# Patient Record
Sex: Female | Born: 1960 | Race: Black or African American | Hispanic: No | Marital: Single | Smoking: Current every day smoker
Health system: Southern US, Community
[De-identification: ages and names within clinical notes are randomized; demographics above are authoritative.]

## PROBLEM LIST (undated history)

## (undated) DIAGNOSIS — Z720 Tobacco use: Secondary | ICD-10-CM

## (undated) DIAGNOSIS — M6282 Rhabdomyolysis: Secondary | ICD-10-CM

## (undated) HISTORY — PX: JOINT REPLACEMENT: SHX530

## (undated) HISTORY — PX: KNEE ARTHROSCOPY: SUR90

---

## 2007-07-18 ENCOUNTER — Emergency Department: Payer: Self-pay | Admitting: Internal Medicine

## 2008-11-30 ENCOUNTER — Emergency Department: Payer: Self-pay | Admitting: Emergency Medicine

## 2011-04-19 ENCOUNTER — Emergency Department: Payer: Self-pay | Admitting: Emergency Medicine

## 2011-06-05 ENCOUNTER — Emergency Department: Payer: Self-pay | Admitting: Emergency Medicine

## 2011-06-06 ENCOUNTER — Emergency Department: Payer: Self-pay | Admitting: Emergency Medicine

## 2011-06-08 ENCOUNTER — Emergency Department: Payer: Self-pay | Admitting: Emergency Medicine

## 2011-06-15 ENCOUNTER — Emergency Department: Payer: Self-pay | Admitting: Emergency Medicine

## 2011-06-16 ENCOUNTER — Emergency Department: Payer: Self-pay | Admitting: Emergency Medicine

## 2011-08-18 ENCOUNTER — Emergency Department: Payer: Self-pay | Admitting: Emergency Medicine

## 2011-08-18 LAB — URINALYSIS, COMPLETE
Bacteria: NONE SEEN
Blood: NEGATIVE
Glucose,UR: NEGATIVE mg/dL (ref 0–75)
Leukocyte Esterase: NEGATIVE
Nitrite: NEGATIVE
Ph: 7 (ref 4.5–8.0)
Protein: NEGATIVE
RBC,UR: 1 /HPF (ref 0–5)
Specific Gravity: 1.011 (ref 1.003–1.030)
Squamous Epithelial: 2
WBC UR: 1 /HPF (ref 0–5)

## 2011-08-18 LAB — CBC WITH DIFFERENTIAL/PLATELET
Basophil %: 0.3 %
Eosinophil #: 0 10*3/uL (ref 0.0–0.7)
Eosinophil %: 0.2 %
HCT: 43.9 % (ref 35.0–47.0)
HGB: 14.7 g/dL (ref 12.0–16.0)
Lymphocyte #: 1.5 10*3/uL (ref 1.0–3.6)
Lymphocyte %: 15.8 %
MCV: 92 fL (ref 80–100)
Monocyte %: 5.5 %
Neutrophil #: 7.5 10*3/uL — ABNORMAL HIGH (ref 1.4–6.5)
Platelet: 310 10*3/uL (ref 150–440)
RBC: 4.79 10*6/uL (ref 3.80–5.20)
WBC: 9.5 10*3/uL (ref 3.6–11.0)

## 2011-08-18 LAB — COMPREHENSIVE METABOLIC PANEL
Albumin: 4.6 g/dL (ref 3.4–5.0)
Alkaline Phosphatase: 90 U/L (ref 50–136)
Bilirubin,Total: 1.7 mg/dL — ABNORMAL HIGH (ref 0.2–1.0)
Calcium, Total: 10 mg/dL (ref 8.5–10.1)
Co2: 28 mmol/L (ref 21–32)
EGFR (Non-African Amer.): 59 — ABNORMAL LOW
Glucose: 117 mg/dL — ABNORMAL HIGH (ref 65–99)
Osmolality: 281 (ref 275–301)
SGOT(AST): 21 U/L (ref 15–37)
SGPT (ALT): 20 U/L
Sodium: 141 mmol/L (ref 136–145)

## 2011-08-18 LAB — DRUG SCREEN, URINE
Barbiturates, Ur Screen: NEGATIVE (ref ?–200)
Cannabinoid 50 Ng, Ur ~~LOC~~: NEGATIVE (ref ?–50)
Cocaine Metabolite,Ur ~~LOC~~: POSITIVE (ref ?–300)
Methadone, Ur Screen: NEGATIVE (ref ?–300)
Opiate, Ur Screen: POSITIVE (ref ?–300)
Phencyclidine (PCP) Ur S: NEGATIVE (ref ?–25)

## 2011-08-18 LAB — LIPASE, BLOOD: Lipase: 58 U/L — ABNORMAL LOW (ref 73–393)

## 2011-09-02 ENCOUNTER — Emergency Department: Payer: Self-pay | Admitting: Unknown Physician Specialty

## 2011-09-02 LAB — COMPREHENSIVE METABOLIC PANEL
Albumin: 4.1 g/dL (ref 3.4–5.0)
Alkaline Phosphatase: 97 U/L (ref 50–136)
Anion Gap: 12 (ref 7–16)
BUN: 8 mg/dL (ref 7–18)
Bilirubin,Total: 1 mg/dL (ref 0.2–1.0)
Chloride: 106 mmol/L (ref 98–107)
Co2: 25 mmol/L (ref 21–32)
Creatinine: 0.72 mg/dL (ref 0.60–1.30)
EGFR (Non-African Amer.): >60
Osmolality: 285 (ref 275–301)
Potassium: 4.1 mmol/L (ref 3.5–5.1)
Sodium: 143 mmol/L (ref 136–145)
Total Protein: 7.6 g/dL (ref 6.4–8.2)

## 2011-09-02 LAB — URINALYSIS, COMPLETE
Bilirubin,UR: NEGATIVE
Glucose,UR: 50 mg/dL (ref 0–75)
Leukocyte Esterase: NEGATIVE
Nitrite: NEGATIVE
Ph: 8 (ref 4.5–8.0)
Protein: NEGATIVE
Specific Gravity: 1.009 (ref 1.003–1.030)
WBC UR: 3 /HPF (ref 0–5)

## 2011-09-02 LAB — CBC
HGB: 13.6 g/dL (ref 12.0–16.0)
MCH: 30.5 pg (ref 26.0–34.0)
MCV: 92 fL (ref 80–100)

## 2011-09-02 LAB — DRUG SCREEN, URINE
Cannabinoid 50 Ng, Ur ~~LOC~~: NEGATIVE (ref ?–50)
Cocaine Metabolite,Ur ~~LOC~~: POSITIVE (ref ?–300)
MDMA (Ecstasy)Ur Screen: NEGATIVE (ref ?–500)
Methadone, Ur Screen: NEGATIVE (ref ?–300)
Opiate, Ur Screen: POSITIVE (ref ?–300)
Phencyclidine (PCP) Ur S: NEGATIVE (ref ?–25)

## 2011-09-02 LAB — RAPID INFLUENZA A&B ANTIGENS

## 2011-09-02 LAB — CK TOTAL AND CKMB (NOT AT ARMC): CK, Total: 133 U/L (ref 21–215)

## 2011-09-02 LAB — TSH: Thyroid Stimulating Horm: 0.282 u[IU]/mL — ABNORMAL LOW

## 2011-09-02 LAB — MAGNESIUM: Magnesium: 1.7 mg/dL — ABNORMAL LOW

## 2011-09-08 LAB — CULTURE, BLOOD (SINGLE)

## 2012-01-28 ENCOUNTER — Emergency Department: Payer: Self-pay | Admitting: Emergency Medicine

## 2012-11-07 ENCOUNTER — Emergency Department: Payer: Self-pay | Admitting: Emergency Medicine

## 2013-02-05 ENCOUNTER — Emergency Department: Payer: Self-pay | Admitting: Unknown Physician Specialty

## 2013-03-09 ENCOUNTER — Emergency Department: Payer: Self-pay | Admitting: Emergency Medicine

## 2013-03-09 LAB — DRUG SCREEN, URINE
Amphetamines, Ur Screen: NEGATIVE (ref ?–1000)
Benzodiazepine, Ur Scrn: NEGATIVE (ref ?–200)
Cannabinoid 50 Ng, Ur ~~LOC~~: POSITIVE (ref ?–50)
MDMA (Ecstasy)Ur Screen: NEGATIVE (ref ?–500)
Methadone, Ur Screen: NEGATIVE (ref ?–300)
Tricyclic, Ur Screen: NEGATIVE (ref ?–1000)

## 2013-03-09 LAB — URINALYSIS, COMPLETE
Bilirubin,UR: NEGATIVE
Ketone: NEGATIVE
Leukocyte Esterase: NEGATIVE
Ph: 6 (ref 4.5–8.0)
Protein: NEGATIVE
Specific Gravity: 1.023 (ref 1.003–1.030)
Squamous Epithelial: 6

## 2013-03-09 LAB — CBC
HGB: 12.9 g/dL (ref 12.0–16.0)
MCH: 31 pg (ref 26.0–34.0)
MCHC: 34.5 g/dL (ref 32.0–36.0)
MCV: 90 fL (ref 80–100)
Platelet: 234 10*3/uL (ref 150–440)
RBC: 4.17 10*6/uL (ref 3.80–5.20)
RDW: 14.2 % (ref 11.5–14.5)
WBC: 9.4 10*3/uL (ref 3.6–11.0)

## 2013-03-09 LAB — COMPREHENSIVE METABOLIC PANEL
Albumin: 3.7 g/dL (ref 3.4–5.0)
Alkaline Phosphatase: 111 U/L (ref 50–136)
Anion Gap: 4 — ABNORMAL LOW (ref 7–16)
Bilirubin,Total: 0.5 mg/dL (ref 0.2–1.0)
Calcium, Total: 8.7 mg/dL (ref 8.5–10.1)
Chloride: 110 mmol/L — ABNORMAL HIGH (ref 98–107)
EGFR (African American): >60
Osmolality: 283 (ref 275–301)
Potassium: 4.3 mmol/L (ref 3.5–5.1)
SGOT(AST): 18 U/L (ref 15–37)
SGPT (ALT): 15 U/L (ref 12–78)
Total Protein: 7 g/dL (ref 6.4–8.2)

## 2013-03-09 LAB — ETHANOL: Ethanol: <3 mg/dL

## 2015-01-31 ENCOUNTER — Emergency Department
Admission: EM | Admit: 2015-01-31 | Discharge: 2015-02-01 | Disposition: A | Discharge status: Home / Self Care | Payer: Self-pay | Attending: Student | Admitting: Student

## 2015-01-31 ENCOUNTER — Encounter: Payer: Self-pay | Admitting: Emergency Medicine

## 2015-01-31 DIAGNOSIS — K047 Periapical abscess without sinus: Secondary | ICD-10-CM | POA: Insufficient documentation

## 2015-01-31 DIAGNOSIS — K0381 Cracked tooth: Secondary | ICD-10-CM | POA: Insufficient documentation

## 2015-01-31 DIAGNOSIS — Z720 Tobacco use: Secondary | ICD-10-CM | POA: Insufficient documentation

## 2015-01-31 DIAGNOSIS — K029 Dental caries, unspecified: Secondary | ICD-10-CM | POA: Insufficient documentation

## 2015-01-31 MED ORDER — PENICILLIN V POTASSIUM 500 MG PO TABS
500.0000 mg | ORAL_TABLET | Freq: Once | ORAL | Status: AC
  Administered 2015-02-01: 500 mg via ORAL

## 2015-01-31 MED ORDER — TRAMADOL HCL 50 MG PO TABS
100.0000 mg | ORAL_TABLET | Freq: Once | ORAL | Status: AC
  Administered 2015-02-01: 100 mg via ORAL

## 2015-01-31 MED ORDER — TRAMADOL HCL 50 MG PO TABS: 50.0000 mg | ORAL_TABLET | Freq: Four times a day (QID) | ORAL | Status: DC | PRN

## 2015-01-31 MED ORDER — PENICILLIN V POTASSIUM 500 MG PO TABS: 500.0000 mg | ORAL_TABLET | Freq: Four times a day (QID) | ORAL | Status: DC

## 2015-01-31 NOTE — ED Provider Notes (Signed)
CSN: [id number]     Arrival date & time 01/31/15  2118 History   First MD Initiated Contact with Patient 01/31/15 2328     Chief Complaint  Patient presents with  . Oral Swelling     (Consider location/radiation/quality/duration/timing/severity/associated sxs/prior Treatment) HPI  54 year old female presents today for evaluation of upper lip swelling. Swelling began 5 days ago and was moderate to severe but has improved over the last 2 days. Pain is 10 out of 10, described as pressure and tightness. Patient states she has a fractured tooth. She has not been taking any medications for pain. She has not had any antibiotic. She denies any trauma or injury. No fevers. She is able tolerate by mouth well. Has been using listerine.    History reviewed. No pertinent past medical history. History reviewed. No pertinent past surgical history. No family history on file. History  Substance Use Topics  . Smoking status: Current Every Day Smoker -- 0.50 packs/day for 40 years    Types: Cigarettes  . Smokeless tobacco: Not on file  . Alcohol Use: No   OB History    No data available     Review of Systems  Constitutional: Negative.  Negative for fever and chills.  HENT: Positive for dental problem and facial swelling. Negative for drooling, mouth sores, trouble swallowing and voice change.   Respiratory: Negative for chest tightness and shortness of breath.   Cardiovascular: Negative for chest pain.  Gastrointestinal: Negative for nausea, vomiting, abdominal pain and diarrhea.  Musculoskeletal: Negative for arthralgias, neck pain and neck stiffness.  Skin: Negative.   Psychiatric/Behavioral: Negative for confusion.  All other systems reviewed and are negative.     Allergies  Review of patient's allergies indicates no known allergies.  Home Medications   Prior to Admission medications   Medication Sig Start Date End Date Taking? Authorizing Provider  penicillin v potassium (VEETID)  500 MG tablet Take 1 tablet (500 mg total) by mouth 4 (four) times daily. 01/31/15   Evon Slack, PA-C  traMADol (ULTRAM) 50 MG tablet Take 1 tablet (50 mg total) by mouth every 6 (six) hours as needed. 01/31/15   Evon Slack, PA-C   BP 138/83 mmHg  Pulse 57  Temp(Src) 98.9 F (37.2 C) (Oral)  Resp 18  Ht 5' 7.5" (1.715 m)  Wt 138 lb (62.596 kg)  BMI 21.28 kg/m2  SpO2 96% Physical Exam  Constitutional: She is oriented to person, place, and time. She appears well-developed and well-nourished. No distress.  HENT:  Head: Normocephalic and atraumatic.  Right Ear: External ear normal.  Left Ear: External ear normal.  Nose: Nose normal.  Mouth/Throat: Uvula is midline and oropharynx is clear and moist. No oral lesions. No trismus in the jaw. Normal dentition. Dental abscesses (positive drainage with compression to abscess.) and dental caries present. No uvula swelling.    Eyes: EOM are normal.  Neck: Normal range of motion. Neck supple.  Cardiovascular: Normal rate.  Exam reveals no gallop and no friction rub.   No murmur heard. Pulmonary/Chest: Effort normal and breath sounds normal. No respiratory distress.  Neurological: She is alert and oriented to person, place, and time.  Skin: Skin is warm and dry.  Psychiatric: She has a normal mood and affect. Her behavior is normal. Thought content normal.    ED Course  Procedures (including critical care time) Labs Review Labs Reviewed - No data to display  Imaging Review No results found.   EKG Interpretation None  MDM   Final diagnoses:  Dental abscess    54 year old female with dental abscess that has improved on its own over the last 2 days. Based on history and exam it appears that abscess has ruptured and has been draining. Minimal drainage on exam today. She will be started with anabiotic's, penicillin VK. She will also be given tramadol for pain. Follow-up with the dental clinic and return to the ER for any  worsening symptoms or changes in her health.    Evon Slack, PA-C 01/31/15 [id number]  Gayla Doss, MD 01/31/15 [phone]

## 2015-01-31 NOTE — ED Notes (Signed)
Patient with complaint of swelling to her top gums and lip that started Sunday. Patient reports that the swelling has gone down some today.

## 2015-01-31 NOTE — Discharge Instructions (Signed)

## 2015-02-01 MED ORDER — PENICILLIN V POTASSIUM 500 MG PO TABS
ORAL_TABLET | ORAL | Status: AC
  Administered 2015-02-01: 500 mg via ORAL
  Filled 2015-02-01: qty 1

## 2015-02-01 MED ORDER — TRAMADOL HCL 50 MG PO TABS
ORAL_TABLET | ORAL | Status: AC
  Administered 2015-02-01: 100 mg via ORAL
  Filled 2015-02-01: qty 2

## 2015-02-01 MED ORDER — TRAMADOL HCL 50 MG PO TABS
ORAL_TABLET | ORAL | Status: AC
  Filled 2015-02-01: qty 1

## 2016-09-23 ENCOUNTER — Emergency Department: Payer: Self-pay

## 2016-09-23 ENCOUNTER — Encounter: Payer: Self-pay | Admitting: Emergency Medicine

## 2016-09-23 ENCOUNTER — Emergency Department
Admission: EM | Admit: 2016-09-23 | Discharge: 2016-09-23 | Disposition: A | Discharge status: Home / Self Care | Payer: Self-pay | Attending: Emergency Medicine | Admitting: Emergency Medicine

## 2016-09-23 DIAGNOSIS — J209 Acute bronchitis, unspecified: Secondary | ICD-10-CM

## 2016-09-23 DIAGNOSIS — F17210 Nicotine dependence, cigarettes, uncomplicated: Secondary | ICD-10-CM | POA: Insufficient documentation

## 2016-09-23 DIAGNOSIS — J219 Acute bronchiolitis, unspecified: Secondary | ICD-10-CM | POA: Insufficient documentation

## 2016-09-23 LAB — INFLUENZA PANEL BY PCR (TYPE A & B)
Influenza A By PCR: NEGATIVE
Influenza B By PCR: NEGATIVE

## 2016-09-23 LAB — CBC WITH DIFFERENTIAL/PLATELET
BASOS PCT: 1 %
Basophils Absolute: 0 10*3/uL (ref 0–0.1)
Eosinophils Absolute: 0.1 10*3/uL (ref 0–0.7)
Eosinophils Relative: 2 %
HEMATOCRIT: 37.7 % (ref 35.0–47.0)
Hemoglobin: 12.9 g/dL (ref 12.0–16.0)
LYMPHS PCT: 36 %
Lymphs Abs: 1.9 10*3/uL (ref 1.0–3.6)
MCH: 31.6 pg (ref 26.0–34.0)
MCHC: 34.1 g/dL (ref 32.0–36.0)
MCV: 92.7 fL (ref 80.0–100.0)
MONO ABS: 0.6 10*3/uL (ref 0.2–0.9)
Monocytes Relative: 11 %
NEUTROS PCT: 50 %
Neutro Abs: 2.7 10*3/uL (ref 1.4–6.5)
Platelets: 223 10*3/uL (ref 150–440)
RBC: 4.07 MIL/uL (ref 3.80–5.20)
RDW: 13.7 % (ref 11.5–14.5)
WBC: 5.4 10*3/uL (ref 3.6–11.0)

## 2016-09-23 LAB — COMPREHENSIVE METABOLIC PANEL
ALT: 28 U/L (ref 14–54)
AST: 35 U/L (ref 15–41)
Albumin: 4.1 g/dL (ref 3.5–5.0)
Alkaline Phosphatase: 97 U/L (ref 38–126)
Anion gap: 10 (ref 5–15)
BUN: 24 mg/dL — ABNORMAL HIGH (ref 6–20)
CO2: 24 mmol/L (ref 22–32)
Calcium: 8.8 mg/dL — ABNORMAL LOW (ref 8.9–10.3)
Chloride: 109 mmol/L (ref 101–111)
Creatinine, Ser: 1.41 mg/dL — ABNORMAL HIGH (ref 0.44–1.00)
GFR calc Af Amer: 48 mL/min — ABNORMAL LOW (ref >60)
GFR calc non Af Amer: 42 mL/min — ABNORMAL LOW (ref >60)
Glucose, Bld: 140 mg/dL — ABNORMAL HIGH (ref 65–99)
Potassium: 3.7 mmol/L (ref 3.5–5.1)
Sodium: 143 mmol/L (ref 135–145)
Total Bilirubin: 0.7 mg/dL (ref 0.3–1.2)
Total Protein: 7.3 g/dL (ref 6.5–8.1)

## 2016-09-23 LAB — TROPONIN I: Troponin I: <0.03 ng/mL (ref <0.03)

## 2016-09-23 MED ORDER — HYDROCOD POLST-CPM POLST ER 10-8 MG/5ML PO SUER: 5.0000 mL | Freq: Two times a day (BID) | ORAL | 0 refills | Status: DC | PRN

## 2016-09-23 MED ORDER — HYDROCOD POLST-CPM POLST ER 10-8 MG/5ML PO SUER
5.0000 mL | Freq: Once | ORAL | Status: AC
Start: 2016-09-23 — End: 2016-09-23
  Administered 2016-09-23: 5 mL via ORAL
  Filled 2016-09-23: qty 5

## 2016-09-23 MED ORDER — AZITHROMYCIN 250 MG PO TABS: ORAL_TABLET | ORAL | 0 refills | Status: AC

## 2016-09-23 NOTE — ED Triage Notes (Signed)
Patient to ER for c/o URI. Patient states she first developed "head cold" approx 1 week ago. Patient states in last day or so she feels like "someone that is 6-700 lbs is sitting on her chest with my breathing". Patient states "I think I might have pneumonia".

## 2016-09-23 NOTE — ED Notes (Signed)
Blood draw delayed d/t patient being hesitant to have blood draw d/t fear of needles.

## 2016-09-23 NOTE — ED Provider Notes (Signed)
[facility] Emergency Department Provider Note    First MD Initiated Contact with Patient 09/23/16 [phone]     (approximate)  I have reviewed the triage vital signs and the nursing notes.   HISTORY  Chief Complaint URI    HPI Heather Henry is a 56 y.o. female presents with one-week history of cough congestion chills and subjective fevers. Patient states that "I think I might have pneumonia". Patient denies any known sick contacts.   Past medical history No pertinent past There are no active problems to display for this patient.   Past Surgical History:  Procedure Laterality Date  . KNEE ARTHROSCOPY Right     Prior to Admission medications   Medication Sig Start Date End Date Taking? Authorizing Provider  azithromycin (ZITHROMAX Z-PAK) 250 MG tablet Take 2 tablets (500 mg) on  Day 1,  followed by 1 tablet (250 mg) once daily on Days 2 through 5. 09/23/16 09/28/16  Darci Currentandolph N Tiahna Cure, MD  chlorpheniramine-HYDROcodone [facility](TUSSIONEX PENNKINETIC ER) 10-8 MG/5ML SUER Take 5 mLs by mouth every 12 (twelve) hours as needed. 09/23/16   Darci Currentandolph N Almee Pelphrey, MD  penicillin v potassium (VEETID) 500 MG tablet Take 1 tablet (500 mg total) by mouth 4 (four) times daily. 01/31/15   Evon Slackhomas C Gaines, PA-C  traMADol (ULTRAM) 50 MG tablet Take 1 tablet (50 mg total) by mouth every 6 (six) hours as needed. 01/31/15   Evon Slackhomas C Gaines, PA-C    Allergies No known drug allergies No family history on file.  Social History Social History  Substance Use Topics  . Smoking status: Current Every Day Smoker    Packs/day: 0.50    Years: 40.00    Types: Cigarettes  . Smokeless tobacco: Never Used  . Alcohol use No    Review of Systems Constitutional: Positive for fever/chills Eyes: No visual changes. ENT: No sore throat. Positive for nasal congestion Cardiovascular: Denies chest pain. Respiratory: Denies shortness of breath.Positive for cough Gastrointestinal: No abdominal  pain.  No nausea, no vomiting.  No diarrhea.  No constipation. Genitourinary: Negative for dysuria. Musculoskeletal: Negative for back pain. Skin: Negative for rash. Neurological: Negative for headaches, focal weakness or numbness.  10-point ROS otherwise negative.  ____________________________________________   PHYSICAL EXAM:  VITAL SIGNS: ED Triage Vitals [09/23/16 0147]  Enc Vitals Group     BP 112/89     Pulse Rate 88     Resp 18     Temp 98.2 F (36.8 C)     Temp Source Oral     SpO2 95 %     Weight 153 lb (69.4 kg)     Height 5' 7.5" (1.715 m)     Head Circumference      Peak Flow      Pain Score 9     Pain Loc      Pain Edu?      Excl. in GC?     Constitutional: Alert and oriented. Well appearing and in no acute distress. Eyes: Conjunctivae are normal. PERRL. EOMI. Head: Atraumatic. Ears:  Healthy appearing ear canals and TMs bilaterally Nose: No congestion/rhinnorhea. Mouth/Throat: Mucous membranes are moist. Oropharynx non-erythematous. Neck: No stridor.  No meningeal signs.  No cervical spine tenderness to palpation Cardiovascular: Normal rate, regular rhythm. Good peripheral circulation. Grossly normal heart sounds. Respiratory: Normal respiratory effort.  No retractions. Lungs CTAB. Gastrointestinal: Soft and nontender. No distention.  Musculoskeletal: No lower extremity tenderness nor edema. No gross deformities of extremities. Neurologic:  Normal speech  and language. No gross focal neurologic deficits are appreciated.  Skin:  Skin is warm, dry and intact. No rash noted. Psychiatric: Mood and affect are normal. Speech and behavior are normal.  ____________________________________________   LABS (all labs ordered are listed, but only abnormal results are displayed)  Labs Reviewed  COMPREHENSIVE METABOLIC PANEL - Abnormal; Notable for the following:       Result Value   Glucose, Bld 140 (*)    BUN 24 (*)    Creatinine, Ser 1.41 (*)    Calcium 8.8  (*)    GFR calc non Af Amer 42 (*)    GFR calc Af Amer 48 (*)    All other components within normal limits  CBC WITH DIFFERENTIAL/PLATELET  TROPONIN I  INFLUENZA PANEL BY PCR (TYPE A & B)   ____________________________________________  EKG  ED ECG REPORT I, [place] N Hulen Mandler, the attending physician, personally viewed and interpreted this ECG.   Date: 09/23/2016  EKG Time: 1:52 AM  Rate: 85  Rhythm: Normal sinus rhythm  Axis: Normal  Intervals: Normal  ST&T Change: None  ____________________________________________  RADIOLOGY I, [place] N Chesney Klimaszewski, personally viewed and evaluated these images (plain radiographs) as part of my medical decision making, as well as reviewing the written report by the radiologist.  Dg Chest 2 View  Result Date: 09/23/2016 CLINICAL DATA:  Upper respiratory infection. Patient developed a head cold 1 week ago. Now with shortness of breath. Mid chest pain. Cough. Smoker. EXAM: CHEST  2 VIEW COMPARISON:  02/06/2013 FINDINGS: Mild hyperinflation. The heart size and mediastinal contours are within normal limits. Both lungs are clear. The visualized skeletal structures are unremarkable. IMPRESSION: No active cardiopulmonary disease. Electronically Signed   By: Burman Nieves M.D.   On: 09/23/2016 02:16    ____________________________________________    Procedures     INITIAL IMPRESSION / ASSESSMENT AND PLAN / ED COURSE  Pertinent labs & imaging results that were available during my care of the patient were reviewed by me and considered in my medical decision making (see chart for details).        ____________________________________________  FINAL CLINICAL IMPRESSION(S) / ED DIAGNOSES  Final diagnoses:  Acute bronchitis, unspecified organism     MEDICATIONS GIVEN DURING THIS VISIT:  Medications  chlorpheniramine-HYDROcodone (TUSSIONEX) 10-8 MG/5ML suspension 5 mL (5 mLs Oral Given 09/23/16 0445)     NEW OUTPATIENT MEDICATIONS  STARTED DURING THIS VISIT:  New Prescriptions   AZITHROMYCIN (ZITHROMAX Z-PAK) 250 MG TABLET    Take 2 tablets (500 mg) on  Day 1,  followed by 1 tablet (250 mg) once daily on Days 2 through 5.   CHLORPHENIRAMINE-HYDROCODONE (TUSSIONEX PENNKINETIC ER) 10-8 MG/5ML SUER    Take 5 mLs by mouth every 12 (twelve) hours as needed.    Modified Medications   No medications on file    Discontinued Medications   No medications on file     Note:  This document was prepared using Dragon voice recognition software and may include unintentional dictation errors.    Darci Current, MD 09/23/16 [phone]

## 2016-09-23 NOTE — ED Notes (Signed)
Patient transported to X-ray 

## 2016-10-04 ENCOUNTER — Emergency Department
Admission: EM | Admit: 2016-10-04 | Discharge: 2016-10-04 | Disposition: A | Discharge status: Home / Self Care | Payer: Self-pay | Attending: Emergency Medicine | Admitting: Emergency Medicine

## 2016-10-04 ENCOUNTER — Encounter: Payer: Self-pay | Admitting: Emergency Medicine

## 2016-10-04 ENCOUNTER — Emergency Department: Payer: Self-pay

## 2016-10-04 DIAGNOSIS — H60501 Unspecified acute noninfective otitis externa, right ear: Secondary | ICD-10-CM | POA: Insufficient documentation

## 2016-10-04 DIAGNOSIS — F17210 Nicotine dependence, cigarettes, uncomplicated: Secondary | ICD-10-CM | POA: Insufficient documentation

## 2016-10-04 DIAGNOSIS — J40 Bronchitis, not specified as acute or chronic: Secondary | ICD-10-CM | POA: Insufficient documentation

## 2016-10-04 LAB — COMPREHENSIVE METABOLIC PANEL
ALBUMIN: 4 g/dL (ref 3.5–5.0)
ALT: 15 U/L (ref 14–54)
ANION GAP: 7 (ref 5–15)
AST: 21 U/L (ref 15–41)
Alkaline Phosphatase: 94 U/L (ref 38–126)
BUN: 24 mg/dL — ABNORMAL HIGH (ref 6–20)
CALCIUM: 9 mg/dL (ref 8.9–10.3)
CO2: 24 mmol/L (ref 22–32)
Chloride: 110 mmol/L (ref 101–111)
Creatinine, Ser: 1.11 mg/dL — ABNORMAL HIGH (ref 0.44–1.00)
GFR calc non Af Amer: 56 mL/min — ABNORMAL LOW (ref >60)
GLUCOSE: 132 mg/dL — AB (ref 65–99)
POTASSIUM: 3.7 mmol/L (ref 3.5–5.1)
SODIUM: 141 mmol/L (ref 135–145)
Total Bilirubin: 0.5 mg/dL (ref 0.3–1.2)
Total Protein: 7.1 g/dL (ref 6.5–8.1)

## 2016-10-04 LAB — CBC
HCT: 37.9 % (ref 35.0–47.0)
Hemoglobin: 13 g/dL (ref 12.0–16.0)
MCH: 31.8 pg (ref 26.0–34.0)
MCHC: 34.4 g/dL (ref 32.0–36.0)
MCV: 92.6 fL (ref 80.0–100.0)
PLATELETS: 260 10*3/uL (ref 150–440)
RBC: 4.1 MIL/uL (ref 3.80–5.20)
RDW: 13.5 % (ref 11.5–14.5)
WBC: 6.5 10*3/uL (ref 3.6–11.0)

## 2016-10-04 LAB — TROPONIN I: Troponin I: <0.03 ng/mL (ref <0.03)

## 2016-10-04 MED ORDER — IBUPROFEN 600 MG PO TABS
600.0000 mg | ORAL_TABLET | Freq: Once | ORAL | Status: AC
Start: 2016-10-04 — End: 2016-10-04
  Administered 2016-10-04: 600 mg via ORAL
  Filled 2016-10-04: qty 1

## 2016-10-04 MED ORDER — OFLOXACIN 0.3 % OT SOLN: 5.0000 [drp] | Freq: Two times a day (BID) | OTIC | 0 refills | Status: AC

## 2016-10-04 MED ORDER — IPRATROPIUM-ALBUTEROL 0.5-2.5 (3) MG/3ML IN SOLN
3.0000 mL | Freq: Once | RESPIRATORY_TRACT | Status: AC
  Administered 2016-10-04: 3 mL via RESPIRATORY_TRACT
  Filled 2016-10-04: qty 3

## 2016-10-04 MED ORDER — PREDNISONE 20 MG PO TABS
60.0000 mg | ORAL_TABLET | Freq: Once | ORAL | Status: AC
  Administered 2016-10-04: 60 mg via ORAL
  Filled 2016-10-04: qty 3

## 2016-10-04 MED ORDER — BENZONATATE 100 MG PO CAPS: 100.0000 mg | ORAL_CAPSULE | Freq: Four times a day (QID) | ORAL | 0 refills | Status: DC | PRN

## 2016-10-04 MED ORDER — ALBUTEROL SULFATE HFA 108 (90 BASE) MCG/ACT IN AERS: 2.0000 | INHALATION_SPRAY | Freq: Four times a day (QID) | RESPIRATORY_TRACT | 0 refills | Status: DC | PRN

## 2016-10-04 MED ORDER — BENZONATATE 100 MG PO CAPS
100.0000 mg | ORAL_CAPSULE | Freq: Once | ORAL | Status: AC
  Administered 2016-10-04: 100 mg via ORAL
  Filled 2016-10-04: qty 1

## 2016-10-04 MED ORDER — ALBUTEROL SULFATE (2.5 MG/3ML) 0.083% IN NEBU
2.5000 mg | INHALATION_SOLUTION | Freq: Once | RESPIRATORY_TRACT | Status: AC
  Administered 2016-10-04: 2.5 mg via RESPIRATORY_TRACT
  Filled 2016-10-04: qty 3

## 2016-10-04 MED ORDER — PREDNISONE 20 MG PO TABS: 60.0000 mg | ORAL_TABLET | Freq: Every day | ORAL | 0 refills | Status: DC

## 2016-10-04 MED ORDER — MAGNESIUM SULFATE 2 GM/50ML IV SOLN
2.0000 g | Freq: Once | INTRAVENOUS | Status: AC
  Administered 2016-10-04: 2 g via INTRAVENOUS
  Filled 2016-10-04: qty 50

## 2016-10-04 NOTE — ED Triage Notes (Signed)
Pt seen here on 2/15 and diagnosed with bronchitis; finished antibiotics as prescribed; pt says she has continued sinus drainage with frontal head pressure; earlier when she blew her nose she felt like her right ear popped; pt in no acute distress; talking in complete coherent sentences

## 2016-10-04 NOTE — ED Notes (Signed)
Pt to xray via stretcher with xray tech

## 2016-10-04 NOTE — Discharge Instructions (Signed)
Please follow-up with the acute care clinic physicians for further evaluation of his symptoms and evaluation for resolution.

## 2016-10-04 NOTE — ED Notes (Signed)
Pt returned to room from xay.

## 2016-10-04 NOTE — ED Provider Notes (Signed)
[facility] Emergency Department Provider Note   ____________________________________________   First MD Initiated Contact with Patient 10/04/16 0145     (approximate)  I have reviewed the triage vital signs and the nursing notes.   HISTORY  Chief Complaint Otalgia and Nasal Congestion    HPI Heather Henry is a 56 y.o. female who comes into the hospital today with bronchitis. She reports that she was here last week and diagnosed with bronchitis. She was given azithromycin and Tussionex. She took the medicine but reports that the symptoms came back stronger. She blew her nose and felt something pop in her right ear. Now she is having pain in her ear and her forehead. Patient has a cough and reports it hurts in her neck and upper chest to breathe. The patient reports that she has yellow sputum. The patient has not taken any other medication. Before she had been taking Mucinex, Alka-Seltzer severe cold. She feels short of breath and has no energy. She denies fevers but has had some sweats. The patient does not have a primary care physician.   History reviewed. No pertinent past medical history.  There are no active problems to display for this patient.   Past Surgical History:  Procedure Laterality Date  . KNEE ARTHROSCOPY Right     Prior to Admission medications   Medication Sig Start Date End Date Taking? Authorizing Provider  albuterol (PROVENTIL HFA;VENTOLIN HFA) 108 (90 Base) MCG/ACT inhaler Inhale 2 puffs into the lungs every 6 (six) hours as needed for wheezing or shortness of breath. 10/04/16   Rebecka Apley, MD  benzonatate (TESSALON PERLES) 100 MG capsule Take 1 capsule (100 mg total) by mouth every 6 (six) hours as needed for cough. 10/04/16   Rebecka Apley, MD  chlorpheniramine-HYDROcodone [facility] ER) 10-8 MG/5ML SUER Take 5 mLs by mouth every 12 (twelve) hours as needed. Patient not taking: Reported on 10/04/2016  09/23/16   Darci Current, MD  ofloxacin (FLOXIN) 0.3 % otic solution Place 5 drops into both ears 2 (two) times daily. 10/04/16 10/11/16  Rebecka Apley, MD  penicillin v potassium (VEETID) 500 MG tablet Take 1 tablet (500 mg total) by mouth 4 (four) times daily. Patient not taking: Reported on 10/04/2016 01/31/15   Evon Slack, PA-C  predniSONE (DELTASONE) 20 MG tablet Take 3 tablets (60 mg total) by mouth daily. 10/04/16   Rebecka Apley, MD  traMADol (ULTRAM) 50 MG tablet Take 1 tablet (50 mg total) by mouth every 6 (six) hours as needed. Patient not taking: Reported on 10/04/2016 01/31/15   Evon Slack, PA-C    Allergies Patient has no known allergies.  History reviewed. No pertinent family history.  Social History Social History  Substance Use Topics  . Smoking status: Current Every Day Smoker    Packs/day: 0.50    Years: 40.00    Types: Cigarettes  . Smokeless tobacco: Never Used  . Alcohol use No    Review of Systems Constitutional: No fever/chills Eyes: No visual changes. ENT: right ear pain Cardiovascular:  chest pain with cough Respiratory: Cough and shortness of breath. Gastrointestinal: No abdominal pain.  No nausea, no vomiting.  No diarrhea.  No constipation. Genitourinary: Negative for dysuria. Musculoskeletal: Negative for back pain. Skin: Negative for rash. Neurological: Negative for headaches, focal weakness or numbness.  10-point ROS otherwise negative.  ____________________________________________   PHYSICAL EXAM:  VITAL SIGNS: ED Triage Vitals  Enc Vitals Group  BP 10/04/16 0040 126/77     Pulse Rate 10/04/16 0040 72     Resp 10/04/16 0040 16     Temp 10/04/16 0040 98.5 F (36.9 C)     Temp Source 10/04/16 0040 Oral     SpO2 10/04/16 0040 99 %     Weight 10/04/16 0030 153 lb (69.4 kg)     Height 10/04/16 0030 5' 7.5" (1.715 m)     Head Circumference --      Peak Flow --      Pain Score --      Pain Loc --      Pain Edu? --        Excl. in GC? --     Constitutional: Alert and oriented. Well appearing and in mild distress. Eyes: Conjunctivae are normal. PERRL. EOMI. Ears: Erythema in the canal of the right ear with pain upon manipulation of the pinna Head: Atraumatic. Nose: No congestion/rhinnorhea. Mouth/Throat: Mucous membranes are moist.  Oropharynx non-erythematous. Cardiovascular: Normal rate, regular rhythm. Grossly normal heart sounds.  Good peripheral circulation. Respiratory: Normal respiratory effort.  No retractions. Mild expiratory wheezing in anterior lung fields Gastrointestinal: Soft and nontender. No distention. Positive bowel sounds Musculoskeletal: No lower extremity tenderness nor edema.   Neurologic:  Normal speech and language.  Skin:  Skin is warm, dry and intact.  Psychiatric: Mood and affect are normal. Speech and behavior are normal.  ____________________________________________   LABS (all labs ordered are listed, but only abnormal results are displayed)  Labs Reviewed  COMPREHENSIVE METABOLIC PANEL - Abnormal; Notable for the following:       Result Value   Glucose, Bld 132 (*)    BUN 24 (*)    Creatinine, Ser 1.11 (*)    GFR calc non Af Amer 56 (*)    All other components within normal limits  CBC  TROPONIN I   ____________________________________________  EKG  none ____________________________________________  RADIOLOGY  none ____________________________________________   PROCEDURES  Procedure(s) performed: None  Procedures  Critical Care performed: No  ____________________________________________   INITIAL IMPRESSION / ASSESSMENT AND PLAN / ED COURSE  Pertinent labs & imaging results that were available during my care of the patient were reviewed by me and considered in my medical decision making (see chart for details).  The patient was seen on Feb 15th with the same symptoms. The patient had blood work and an x-ray that was unremarkable at that  time. She reports that she felt she was getting better but the symptoms returned. Given the patient's wheezing this time I'll give her a DuoNeb treatment as well as some prednisone. The patient does appear to have some otitis externa as well. I will consider given the patient levofloxacin as she recently completed a course of azithromycin. I will reassess the patient when she's receive her medications.  Clinical Course as of Oct 04 538  Mon Oct 04, 2016  0300 I did go back in to reassess the patient and she now has increased wheezing. I will give the patient 2 more stacked DuoNeb's and then reassess the patient again.  [AW]  0502 Peribronchial thickening noted.  Lungs otherwise grossly clear. DG Chest 2 View [AW]    Clinical Course User Index [AW] Rebecka ApleyAllison P Gloyd Happ, MD   After 3 duo nebs and steroids the patient still had some wheezing. I went ahead and check some blood work and a chest x-ray. The patient's blood work is unremarkable and her chest x-ray is also negative. I  gave the patient's of magnesium sulfate and 2 more doses of albuterol and her lungs were clear. She'll be discharged home to follow-up with the acute care clinic.  ____________________________________________   FINAL CLINICAL IMPRESSION(S) / ED DIAGNOSES  Final diagnoses:  Bronchitis  Acute otitis externa of right ear, unspecified type      NEW MEDICATIONS STARTED DURING THIS VISIT:  New Prescriptions   ALBUTEROL (PROVENTIL HFA;VENTOLIN HFA) 108 (90 BASE) MCG/ACT INHALER    Inhale 2 puffs into the lungs every 6 (six) hours as needed for wheezing or shortness of breath.   BENZONATATE (TESSALON PERLES) 100 MG CAPSULE    Take 1 capsule (100 mg total) by mouth every 6 (six) hours as needed for cough.   OFLOXACIN (FLOXIN) 0.3 % OTIC SOLUTION    Place 5 drops into both ears 2 (two) times daily.   PREDNISONE (DELTASONE) 20 MG TABLET    Take 3 tablets (60 mg total) by mouth daily.     Note:  This document was prepared  using Dragon voice recognition software and may include unintentional dictation errors.    Rebecka Apley, MD 10/04/16 [phone]

## 2017-09-26 ENCOUNTER — Emergency Department
Admission: EM | Admit: 2017-09-26 | Discharge: 2017-09-26 | Disposition: A | Discharge status: Home / Self Care | Payer: BLUE CROSS/BLUE SHIELD | Attending: Emergency Medicine | Admitting: Emergency Medicine

## 2017-09-26 ENCOUNTER — Encounter: Payer: Self-pay | Admitting: Emergency Medicine

## 2017-09-26 ENCOUNTER — Other Ambulatory Visit: Payer: Self-pay

## 2017-09-26 ENCOUNTER — Emergency Department: Payer: BLUE CROSS/BLUE SHIELD

## 2017-09-26 DIAGNOSIS — F17210 Nicotine dependence, cigarettes, uncomplicated: Secondary | ICD-10-CM | POA: Diagnosis not present

## 2017-09-26 DIAGNOSIS — R1012 Left upper quadrant pain: Secondary | ICD-10-CM | POA: Insufficient documentation

## 2017-09-26 DIAGNOSIS — R0789 Other chest pain: Secondary | ICD-10-CM | POA: Diagnosis not present

## 2017-09-26 LAB — COMPREHENSIVE METABOLIC PANEL
ALBUMIN: 4.4 g/dL (ref 3.5–5.0)
ALT: 20 U/L (ref 14–54)
ANION GAP: 7 (ref 5–15)
AST: 22 U/L (ref 15–41)
Alkaline Phosphatase: 94 U/L (ref 38–126)
BUN: 18 mg/dL (ref 6–20)
CALCIUM: 9 mg/dL (ref 8.9–10.3)
CHLORIDE: 107 mmol/L (ref 101–111)
CO2: 26 mmol/L (ref 22–32)
Creatinine, Ser: 1.15 mg/dL — ABNORMAL HIGH (ref 0.44–1.00)
GFR calc Af Amer: >60 mL/min (ref >60)
GFR calc non Af Amer: 53 mL/min — ABNORMAL LOW (ref >60)
GLUCOSE: 100 mg/dL — AB (ref 65–99)
Potassium: 3.6 mmol/L (ref 3.5–5.1)
SODIUM: 140 mmol/L (ref 135–145)
Total Bilirubin: 1.1 mg/dL (ref 0.3–1.2)
Total Protein: 7.6 g/dL (ref 6.5–8.1)

## 2017-09-26 LAB — URINALYSIS, COMPLETE (UACMP) WITH MICROSCOPIC
BACTERIA UA: NONE SEEN
Bilirubin Urine: NEGATIVE
Glucose, UA: NEGATIVE mg/dL
Hgb urine dipstick: NEGATIVE
KETONES UR: NEGATIVE mg/dL
Leukocytes, UA: NEGATIVE
Nitrite: NEGATIVE
PROTEIN: NEGATIVE mg/dL
Specific Gravity, Urine: >1.046 — ABNORMAL HIGH (ref 1.005–1.030)
pH: 6 (ref 5.0–8.0)

## 2017-09-26 LAB — CBC
HEMATOCRIT: 39.6 % (ref 35.0–47.0)
HEMOGLOBIN: 13.5 g/dL (ref 12.0–16.0)
MCH: 30.9 pg (ref 26.0–34.0)
MCHC: 34.1 g/dL (ref 32.0–36.0)
MCV: 90.6 fL (ref 80.0–100.0)
Platelets: 272 10*3/uL (ref 150–440)
RBC: 4.37 MIL/uL (ref 3.80–5.20)
RDW: 14.1 % (ref 11.5–14.5)
WBC: 7 10*3/uL (ref 3.6–11.0)

## 2017-09-26 LAB — TROPONIN I: Troponin I: <0.03 ng/mL (ref <0.03)

## 2017-09-26 LAB — FIBRIN DERIVATIVES D-DIMER (ARMC ONLY): FIBRIN DERIVATIVES D-DIMER (ARMC): 373.76 ng{FEU}/mL (ref 0.00–499.00)

## 2017-09-26 LAB — LIPASE, BLOOD: Lipase: 24 U/L (ref 11–51)

## 2017-09-26 MED ORDER — OXYCODONE-ACETAMINOPHEN 5-325 MG PO TABS: 1.0000 | ORAL_TABLET | Freq: Three times a day (TID) | ORAL | 0 refills | Status: DC | PRN

## 2017-09-26 MED ORDER — IOPAMIDOL (ISOVUE-300) INJECTION 61%
85.0000 mL | Freq: Once | INTRAVENOUS | Status: AC | PRN
Start: 2017-09-26 — End: 2017-09-26
  Administered 2017-09-26: 85 mL via INTRAVENOUS

## 2017-09-26 MED ORDER — GI COCKTAIL ~~LOC~~
30.0000 mL | Freq: Once | ORAL | Status: AC
  Administered 2017-09-26: 30 mL via ORAL
  Filled 2017-09-26: qty 30

## 2017-09-26 NOTE — ED Triage Notes (Signed)
Pt reports pain to upper left abd quadrant/up under left rib cage area for 1 week; pt says she spread some rocks in her driveway last Saturday and the pain was really bad at that time; pt reports swelling to area as well as "gurgling" sensation; last BM yesterday was normal; denies urinary s/s; vomiting Sunday morning but none since; pt says pain is worse with certain positions and other positions ease her pain; talking in complete coherent sentences;

## 2017-09-26 NOTE — ED Notes (Signed)
PT c/o RT rib cage pain with cough x1wk denies fever. Pt A&Ox4, speech clear , VS stable
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
## 2017-09-26 NOTE — ED Provider Notes (Signed)
On repeat examination she does have some abdominal wall tenderness, labs and CT were unremarkable for any acute findings.  There is mild pancreatic ductal dilatation with normal lipase of uncertain significance.  I have advised she needs close outpatient follow-up.  She will be discharged with pain medicine.   Emily FilbertWilliams, Paw Karstens E, MD 09/26/17 [id number]

## 2017-09-27 LAB — URINE CULTURE
CULTURE: NO GROWTH
Special Requests: NORMAL

## 2017-10-19 ENCOUNTER — Emergency Department: Payer: BLUE CROSS/BLUE SHIELD

## 2017-10-19 ENCOUNTER — Emergency Department
Admission: EM | Admit: 2017-10-19 | Discharge: 2017-10-19 | Disposition: A | Discharge status: Home / Self Care | Payer: BLUE CROSS/BLUE SHIELD | Attending: Emergency Medicine | Admitting: Emergency Medicine

## 2017-10-19 DIAGNOSIS — S63501A Unspecified sprain of right wrist, initial encounter: Secondary | ICD-10-CM | POA: Diagnosis not present

## 2017-10-19 DIAGNOSIS — Y939 Activity, unspecified: Secondary | ICD-10-CM | POA: Diagnosis not present

## 2017-10-19 DIAGNOSIS — Y929 Unspecified place or not applicable: Secondary | ICD-10-CM | POA: Diagnosis not present

## 2017-10-19 DIAGNOSIS — M79601 Pain in right arm: Secondary | ICD-10-CM

## 2017-10-19 DIAGNOSIS — F17210 Nicotine dependence, cigarettes, uncomplicated: Secondary | ICD-10-CM | POA: Diagnosis not present

## 2017-10-19 DIAGNOSIS — S6991XA Unspecified injury of right wrist, hand and finger(s), initial encounter: Secondary | ICD-10-CM | POA: Diagnosis present

## 2017-10-19 DIAGNOSIS — Y999 Unspecified external cause status: Secondary | ICD-10-CM | POA: Insufficient documentation

## 2017-10-19 MED ORDER — HYDROCODONE-ACETAMINOPHEN 5-325 MG PO TABS: 1.0000 | ORAL_TABLET | Freq: Four times a day (QID) | ORAL | 0 refills | Status: DC | PRN

## 2017-10-19 MED ORDER — HYDROCODONE-ACETAMINOPHEN 5-325 MG PO TABS
1.0000 | ORAL_TABLET | ORAL | Status: AC
  Administered 2017-10-19: 1 via ORAL
  Filled 2017-10-19: qty 1

## 2017-10-19 NOTE — Discharge Instructions (Signed)
Please wear wrist brace on the right upper extremity at all times except for showering.  Take Tylenol for mild to moderate pain.  Take Norco as prescribed for more severe pain.  Rest ice and elevate the upper extremity.  Follow-up with orthopedics if no improvement in 5-7 days.
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
## 2017-12-20 ENCOUNTER — Other Ambulatory Visit: Payer: Self-pay

## 2017-12-20 ENCOUNTER — Emergency Department
Admission: EM | Admit: 2017-12-20 | Discharge: 2017-12-20 | Disposition: A | Discharge status: Home / Self Care | Payer: BLUE CROSS/BLUE SHIELD | Attending: Emergency Medicine | Admitting: Emergency Medicine

## 2017-12-20 ENCOUNTER — Encounter: Payer: Self-pay | Admitting: Emergency Medicine

## 2017-12-20 DIAGNOSIS — Z79899 Other long term (current) drug therapy: Secondary | ICD-10-CM | POA: Insufficient documentation

## 2017-12-20 DIAGNOSIS — F17210 Nicotine dependence, cigarettes, uncomplicated: Secondary | ICD-10-CM | POA: Insufficient documentation

## 2017-12-20 DIAGNOSIS — M67431 Ganglion, right wrist: Secondary | ICD-10-CM | POA: Insufficient documentation

## 2017-12-20 DIAGNOSIS — M6740 Ganglion, unspecified site: Secondary | ICD-10-CM

## 2017-12-20 MED ORDER — MELOXICAM 15 MG PO TABS: 15.0000 mg | ORAL_TABLET | Freq: Every day | ORAL | 1 refills | Status: AC

## 2017-12-20 NOTE — ED Notes (Signed)
Pt given Discharge instructions, RX and follow up. Did not want VS. She was upset she did not get Narcotics for her cyst and stormed out the door. She was ambulatory without difficulty.

## 2017-12-20 NOTE — ED Triage Notes (Signed)
Pt in via POV with complaints of "knot" to right hand.  Pt reports being seen here before for same, imaging unremarkable, brace provided.  Pt reports brace makes pain worse, pain shooting down into fingers and up to elbow.  Pt ambulatory to triage, vitals WDL, NAD noted at this time.
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
## 2019-01-31 ENCOUNTER — Other Ambulatory Visit: Payer: Self-pay

## 2019-01-31 ENCOUNTER — Emergency Department
Admission: EM | Admit: 2019-01-31 | Discharge: 2019-02-01 | Disposition: A | Discharge status: Home / Self Care | Payer: Self-pay | Attending: Emergency Medicine | Admitting: Emergency Medicine

## 2019-01-31 ENCOUNTER — Encounter: Payer: Self-pay | Admitting: Emergency Medicine

## 2019-01-31 DIAGNOSIS — Y92017 Garden or yard in single-family (private) house as the place of occurrence of the external cause: Secondary | ICD-10-CM | POA: Insufficient documentation

## 2019-01-31 DIAGNOSIS — Y93H2 Activity, gardening and landscaping: Secondary | ICD-10-CM | POA: Insufficient documentation

## 2019-01-31 DIAGNOSIS — T161XXA Foreign body in right ear, initial encounter: Secondary | ICD-10-CM | POA: Insufficient documentation

## 2019-01-31 DIAGNOSIS — Y999 Unspecified external cause status: Secondary | ICD-10-CM | POA: Insufficient documentation

## 2019-01-31 DIAGNOSIS — W57XXXA Bitten or stung by nonvenomous insect and other nonvenomous arthropods, initial encounter: Secondary | ICD-10-CM | POA: Insufficient documentation

## 2019-01-31 DIAGNOSIS — F17210 Nicotine dependence, cigarettes, uncomplicated: Secondary | ICD-10-CM | POA: Insufficient documentation

## 2019-01-31 DIAGNOSIS — T7840XA Allergy, unspecified, initial encounter: Secondary | ICD-10-CM | POA: Insufficient documentation

## 2019-01-31 DIAGNOSIS — R0602 Shortness of breath: Secondary | ICD-10-CM | POA: Insufficient documentation
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
## 2019-02-01 MED ORDER — IPRATROPIUM-ALBUTEROL 0.5-2.5 (3) MG/3ML IN SOLN
3.0000 mL | Freq: Once | RESPIRATORY_TRACT | Status: AC
  Administered 2019-02-01: 3 mL via RESPIRATORY_TRACT
  Filled 2019-02-01: qty 3

## 2019-02-01 MED ORDER — SODIUM CHLORIDE 0.9 % IV BOLUS
1000.0000 mL | Freq: Once | INTRAVENOUS | Status: AC
  Administered 2019-02-01: 1000 mL via INTRAVENOUS

## 2019-02-01 MED ORDER — DIPHENHYDRAMINE HCL 50 MG/ML IJ SOLN
25.0000 mg | Freq: Once | INTRAMUSCULAR | Status: AC
  Administered 2019-02-01: 25 mg via INTRAVENOUS
  Filled 2019-02-01: qty 1

## 2019-02-01 NOTE — ED Provider Notes (Signed)
[facility] Emergency Department Provider Note  ____________________________________________  Time seen: Approximately 12:17 AM  I have reviewed the triage vital signs and the nursing notes.   HISTORY  Chief Complaint Allergic Reaction   HPI Heather Henry is a 58 y.o. female no significant past medical history who presents for evaluation of hives.  Patient reports that yesterday she was working in her yard when a bug flew into her right ear.  She reports that the bug is still in there. Last night she started having generalized hives and SOB. She denies angioedema, tongue swelling, difficulty swallowing, dizziness, nausea, vomiting, diarrhea.  Patient denies history of asthma or COPD but she does smoke.  Her shortness of breath is constant and mild.  She denies any new medications.   History reviewed. No pertinent past medical history.  There are no active problems to display for this patient.   Past Surgical History:  Procedure Laterality Date  . JOINT REPLACEMENT    . KNEE ARTHROSCOPY Right     Prior to Admission medications   Medication Sig Start Date End Date Taking? Authorizing Provider  albuterol (PROVENTIL HFA;VENTOLIN HFA) 108 (90 Base) MCG/ACT inhaler Inhale 2 puffs into the lungs every 6 (six) hours as needed for wheezing or shortness of breath. 10/04/16   Rebecka ApleyWebster, Allison P, MD  benzonatate (TESSALON PERLES) 100 MG capsule Take 1 capsule (100 mg total) by mouth every 6 (six) hours as needed for cough. Patient not taking: Reported on 09/26/2017 10/04/16   Rebecka ApleyWebster, Allison P, MD  chlorpheniramine-HYDROcodone [facility](TUSSIONEX PENNKINETIC ER) 10-8 MG/5ML SUER Take 5 mLs by mouth every 12 (twelve) hours as needed. Patient not taking: Reported on 10/04/2016 09/23/16   Darci CurrentBrown, [place] N, MD  HYDROcodone-acetaminophen [facility](NORCO) 5-325 MG tablet Take 1 tablet by mouth every 6 (six) hours as needed for moderate pain. 10/19/17   Evon SlackGaines, Thomas C, PA-C   oxyCODONE-acetaminophen (PERCOCET) 5-325 MG tablet Take 1-2 tablets by mouth every 8 (eight) hours as needed. 09/26/17   Emily FilbertWilliams, Jonathan E, MD  predniSONE (DELTASONE) 20 MG tablet Take 3 tablets (60 mg total) by mouth daily. Patient not taking: Reported on 09/26/2017 10/04/16   Rebecka ApleyWebster, Allison P, MD  traMADol (ULTRAM) 50 MG tablet Take 1 tablet (50 mg total) by mouth every 6 (six) hours as needed. Patient not taking: Reported on 10/04/2016 01/31/15   Evon SlackGaines, Thomas C, PA-C    Allergies Patient has no known allergies.  No family history on file.  Social History Social History   Tobacco Use  . Smoking status: Current Every Day Smoker    Packs/day: 0.50    Years: 40.00    Pack years: 20.00    Types: Cigarettes  . Smokeless tobacco: Never Used  Substance Use Topics  . Alcohol use: Yes    Comment: rarely  . Drug use: Yes    Types: Marijuana    Comment: last used yesterday    Review of Systems  Constitutional: Negative for fever. Eyes: Negative for visual changes. ENT: Negative for sore throat. Neck: No neck pain  Cardiovascular: Negative for chest pain. Respiratory: + shortness of breath. Gastrointestinal: Negative for abdominal pain, vomiting or diarrhea. Genitourinary: Negative for dysuria. Musculoskeletal: Negative for back pain. Skin: + hives Neurological: Negative for headaches, weakness or numbness. Psych: No SI or HI  ____________________________________________   PHYSICAL EXAM:  VITAL SIGNS: ED Triage Vitals  Enc Vitals Group     BP 01/31/19 2353 (!) 154/137     Pulse Rate 01/31/19 2353  77     Resp 01/31/19 2353 20     Temp 01/31/19 2353 98 F (36.7 C)     Temp Source 01/31/19 2353 Oral     SpO2 01/31/19 2353 95 %     Weight 01/31/19 2354 145 lb (65.8 kg)     Height 01/31/19 2354 5\' 7"  (1.702 m)     Head Circumference --      Peak Flow --      Pain Score 01/31/19 2354 10     Pain Loc --      Pain Edu? --      Excl. in [place]? --     Constitutional:  Alert and oriented. Well appearing and in no apparent distress. HEENT:      Head: Normocephalic and atraumatic.         Eyes: Conjunctivae are normal. Sclera is non-icteric.       Ear: black foreign body seen inside the R ear      Mouth/Throat: Mucous membranes are moist.  No angioedema, tongue and uvula have normal size, midline, no stridor, airways patent      Neck: Supple with no signs of meningismus. Cardiovascular: Regular rate and rhythm. No murmurs, gallops, or rubs. Respiratory: Normal respiratory effort. Lungs are clear to auscultation bilaterally. No wheezes, crackles, or rhonchi.  Slightly decreased air movement mostly on the right Gastrointestinal: Soft, non tender, and non distended with positive bowel sounds. No rebound or guarding. Musculoskeletal: Nontender with normal range of motion in all extremities. No edema, cyanosis, or erythema of extremities. Neurologic: Normal speech and language. Face is symmetric. Moving all extremities. No gross focal neurologic deficits are appreciated. Skin: Skin is warm, dry and intact. Diffuse hives Psychiatric: Mood and affect are normal. Speech and behavior are normal.  ____________________________________________   LABS (all labs ordered are listed, but only abnormal results are displayed)  Labs Reviewed - No data to display ____________________________________________  EKG  none  ____________________________________________  RADIOLOGY  none  ____________________________________________   PROCEDURES  Procedure(s) performed: None .Foreign Body Removal  Date/Time: 02/01/2019 1:17 AM Performed by: Rudene Re, MD Authorized by: Rudene Re, MD  Consent: Verbal consent obtained. Risks and benefits: risks, benefits and alternatives were discussed Consent given by: patient Patient understanding: patient states understanding of the procedure being performed Patient identity confirmed: verbally with patient Body  area: ear Location details: right ear  Sedation: Patient sedated: no  Patient restrained: no Localization method: ENT speculum Removal mechanism: forceps and irrigation Complexity: simple 1 objects recovered. Objects recovered: insect Post-procedure assessment: foreign body removed Patient tolerance: patient tolerated the procedure well with no immediate complications   Critical Care performed:  None ____________________________________________   INITIAL IMPRESSION / ASSESSMENT AND PLAN / ED COURSE   58 y.o. female no significant past medical history who presents for evaluation of hives and shortness of breath.  Symptoms started several hours after a bug flew into her right ear.  There is an obvious black foreign body inside her right ear.  We will remove it with irrigation.  Patient has diffuse hives but no evidence of angioedema or any airway compromise.  She does have slightly diminished air movement but no wheezing, normal work of breathing, normal sats.  With a history of smoking complaining of mild shortness of breath in the setting of allergic reaction will treat with a breathing treatment.  Will give Benadryl for hives.  Blood pressures on the soft side with systolics in the 76H.  Patient is asymptomatic.  Review of epic shows that patient has BPs usually between 100- 120.  Will give a liter of IV fluids and IV Benadryl.    _________________________ 1:45 AM on 02/01/2019 -----------------------------------------  Large insect was removed from the right ear, TM intact.  Resolution of hives and shortness of breath after IV Benadryl and 1 DuoNeb.  Patient is stable for discharge home at this time.  Discussed my standard return precautions and follow-up with PCP.   As part of my medical decision making, I reviewed the following data within the electronic MEDICAL RECORD NUMBER Nursing notes reviewed and incorporated, Old chart reviewed, Notes from prior ED visits and [place] Controlled  Substance Database    Pertinent labs & imaging results that were available during my care of the patient were reviewed by me and considered in my medical decision making (see chart for details).    ____________________________________________   FINAL CLINICAL IMPRESSION(S) / ED DIAGNOSES  Final diagnoses:  Allergic reaction, initial encounter      NEW MEDICATIONS STARTED DURING THIS VISIT:  ED Discharge Orders    None       Note:  This document was prepared using Dragon voice recognition software and may include unintentional dictation errors.    Don PerkingVeronese, [place], MD 02/01/19 [phone]

## 2019-02-01 NOTE — ED Notes (Signed)
Topaz pad not working in room. This RN reviewed discharge paperwork with pt.

## 2019-02-01 NOTE — ED Notes (Signed)
Large brown insect removed from pt's right ear. Pt states that she feels a lot better.

## 2019-04-05 ENCOUNTER — Encounter: Payer: Self-pay | Admitting: Emergency Medicine

## 2019-04-05 ENCOUNTER — Inpatient Hospital Stay
Admission: EM | Admit: 2019-04-05 | Discharge: 2019-04-05 | DRG: 683 | Discharge status: Against Medical Advice | Payer: Self-pay | Attending: Internal Medicine | Admitting: Internal Medicine

## 2019-04-05 ENCOUNTER — Emergency Department: Payer: Self-pay

## 2019-04-05 ENCOUNTER — Other Ambulatory Visit: Payer: Self-pay

## 2019-04-05 DIAGNOSIS — F17210 Nicotine dependence, cigarettes, uncomplicated: Secondary | ICD-10-CM | POA: Diagnosis present

## 2019-04-05 DIAGNOSIS — N179 Acute kidney failure, unspecified: Principal | ICD-10-CM | POA: Diagnosis present

## 2019-04-05 DIAGNOSIS — R748 Abnormal levels of other serum enzymes: Secondary | ICD-10-CM

## 2019-04-05 DIAGNOSIS — E860 Dehydration: Secondary | ICD-10-CM | POA: Diagnosis present

## 2019-04-05 DIAGNOSIS — R0602 Shortness of breath: Secondary | ICD-10-CM

## 2019-04-05 DIAGNOSIS — E876 Hypokalemia: Secondary | ICD-10-CM | POA: Diagnosis present

## 2019-04-05 DIAGNOSIS — Z20828 Contact with and (suspected) exposure to other viral communicable diseases: Secondary | ICD-10-CM | POA: Diagnosis present

## 2019-04-05 DIAGNOSIS — E871 Hypo-osmolality and hyponatremia: Secondary | ICD-10-CM | POA: Diagnosis present

## 2019-04-05 DIAGNOSIS — M6282 Rhabdomyolysis: Secondary | ICD-10-CM | POA: Diagnosis present

## 2019-04-05 LAB — URINALYSIS, COMPLETE (UACMP) WITH MICROSCOPIC
Bilirubin Urine: NEGATIVE
Glucose, UA: NEGATIVE mg/dL
Ketones, ur: NEGATIVE mg/dL
Leukocytes,Ua: NEGATIVE
Nitrite: NEGATIVE
Protein, ur: NEGATIVE mg/dL
Specific Gravity, Urine: 1.011 (ref 1.005–1.030)
pH: 5 (ref 5.0–8.0)

## 2019-04-05 LAB — URINE DRUG SCREEN, QUALITATIVE (ARMC ONLY)
Amphetamines, Ur Screen: NOT DETECTED
Barbiturates, Ur Screen: NOT DETECTED
Benzodiazepine, Ur Scrn: NOT DETECTED
Cannabinoid 50 Ng, Ur ~~LOC~~: POSITIVE — AB
Cocaine Metabolite,Ur ~~LOC~~: NOT DETECTED
MDMA (Ecstasy)Ur Screen: NOT DETECTED
Methadone Scn, Ur: NOT DETECTED
Opiate, Ur Screen: POSITIVE — AB
Phencyclidine (PCP) Ur S: NOT DETECTED
Tricyclic, Ur Screen: NOT DETECTED

## 2019-04-05 LAB — CBC WITH DIFFERENTIAL/PLATELET
Abs Immature Granulocytes: 0.03 10*3/uL (ref 0.00–0.07)
Basophils Absolute: 0 10*3/uL (ref 0.0–0.1)
Basophils Relative: 0 %
Eosinophils Absolute: 0 10*3/uL (ref 0.0–0.5)
Eosinophils Relative: 0 %
HCT: 48 % — ABNORMAL HIGH (ref 36.0–46.0)
Hemoglobin: 16.1 g/dL — ABNORMAL HIGH (ref 12.0–15.0)
Immature Granulocytes: 0 %
Lymphocytes Relative: 35 %
Lymphs Abs: 2.9 10*3/uL (ref 0.7–4.0)
MCH: 29.9 pg (ref 26.0–34.0)
MCHC: 33.5 g/dL (ref 30.0–36.0)
MCV: 89.1 fL (ref 80.0–100.0)
Monocytes Absolute: 0.8 10*3/uL (ref 0.1–1.0)
Monocytes Relative: 9 %
Neutro Abs: 4.6 10*3/uL (ref 1.7–7.7)
Neutrophils Relative %: 56 %
Platelets: 322 10*3/uL (ref 150–400)
RBC: 5.39 MIL/uL — ABNORMAL HIGH (ref 3.87–5.11)
RDW: 13.2 % (ref 11.5–15.5)
WBC: 8.3 10*3/uL (ref 4.0–10.5)
nRBC: 0 % (ref 0.0–0.2)

## 2019-04-05 LAB — LIPASE, BLOOD: Lipase: 30 U/L (ref 11–51)

## 2019-04-05 LAB — COMPREHENSIVE METABOLIC PANEL
ALT: 31 U/L (ref 0–44)
AST: 42 U/L — ABNORMAL HIGH (ref 15–41)
Albumin: 4.3 g/dL (ref 3.5–5.0)
Alkaline Phosphatase: 86 U/L (ref 38–126)
Anion gap: 13 (ref 5–15)
BUN: 50 mg/dL — ABNORMAL HIGH (ref 6–20)
CO2: 26 mmol/L (ref 22–32)
Calcium: 9.2 mg/dL (ref 8.9–10.3)
Chloride: 93 mmol/L — ABNORMAL LOW (ref 98–111)
Creatinine, Ser: 1.95 mg/dL — ABNORMAL HIGH (ref 0.44–1.00)
GFR calc Af Amer: 33 mL/min — ABNORMAL LOW (ref >60)
GFR calc non Af Amer: 28 mL/min — ABNORMAL LOW (ref >60)
Glucose, Bld: 112 mg/dL — ABNORMAL HIGH (ref 70–99)
Potassium: 3.2 mmol/L — ABNORMAL LOW (ref 3.5–5.1)
Sodium: 132 mmol/L — ABNORMAL LOW (ref 135–145)
Total Bilirubin: 2.1 mg/dL — ABNORMAL HIGH (ref 0.3–1.2)
Total Protein: 7.7 g/dL (ref 6.5–8.1)

## 2019-04-05 LAB — SARS CORONAVIRUS 2 BY RT PCR (HOSPITAL ORDER, PERFORMED IN ~~LOC~~ HOSPITAL LAB): SARS Coronavirus 2: NEGATIVE

## 2019-04-05 LAB — CK: Total CK: 3035 U/L — ABNORMAL HIGH (ref 38–234)

## 2019-04-05 LAB — MAGNESIUM: Magnesium: 2.5 mg/dL — ABNORMAL HIGH (ref 1.7–2.4)

## 2019-04-05 MED ORDER — SODIUM CHLORIDE 0.9 % IV BOLUS
1000.0000 mL | Freq: Once | INTRAVENOUS | Status: AC
  Administered 2019-04-05: 1000 mL via INTRAVENOUS

## 2019-04-05 MED ORDER — ONDANSETRON HCL 4 MG PO TABS: 4.0000 mg | ORAL_TABLET | Freq: Four times a day (QID) | ORAL | Status: DC | PRN

## 2019-04-05 MED ORDER — ONDANSETRON HCL 4 MG/2ML IJ SOLN: 4.0000 mg | Freq: Four times a day (QID) | INTRAMUSCULAR | Status: DC | PRN

## 2019-04-05 MED ORDER — HEPARIN SODIUM (PORCINE) 5000 UNIT/ML IJ SOLN: 5000.0000 [IU] | Freq: Three times a day (TID) | INTRAMUSCULAR | Status: DC

## 2019-04-05 MED ORDER — ACETAMINOPHEN 500 MG PO TABS
1000.0000 mg | ORAL_TABLET | Freq: Once | ORAL | Status: AC
  Administered 2019-04-05: 1000 mg via ORAL
  Filled 2019-04-05: qty 2

## 2019-04-05 MED ORDER — ACETAMINOPHEN 325 MG PO TABS: 650.0000 mg | ORAL_TABLET | Freq: Four times a day (QID) | ORAL | Status: DC | PRN

## 2019-04-05 MED ORDER — HYDROCODONE-ACETAMINOPHEN 5-325 MG PO TABS: 1.0000 | ORAL_TABLET | ORAL | Status: DC | PRN

## 2019-04-05 MED ORDER — ACETAMINOPHEN 650 MG RE SUPP: 650.0000 mg | Freq: Four times a day (QID) | RECTAL | Status: DC | PRN

## 2019-04-05 MED ORDER — POTASSIUM CHLORIDE IN NACL 20-0.9 MEQ/L-% IV SOLN
INTRAVENOUS | Status: DC
  Filled 2019-04-05 (×3): qty 1000

## 2019-04-05 MED ORDER — SENNOSIDES-DOCUSATE SODIUM 8.6-50 MG PO TABS: 1.0000 | ORAL_TABLET | Freq: Every evening | ORAL | Status: DC | PRN

## 2019-04-05 MED ORDER — BISACODYL 5 MG PO TBEC: 5.0000 mg | DELAYED_RELEASE_TABLET | Freq: Every day | ORAL | Status: DC | PRN

## 2019-04-05 MED ORDER — ALBUTEROL SULFATE (2.5 MG/3ML) 0.083% IN NEBU: 2.5000 mg | INHALATION_SOLUTION | RESPIRATORY_TRACT | Status: DC | PRN

## 2019-04-05 MED ORDER — ONDANSETRON HCL 4 MG/2ML IJ SOLN
4.0000 mg | Freq: Once | INTRAMUSCULAR | Status: AC
  Administered 2019-04-05: 4 mg via INTRAVENOUS
  Filled 2019-04-05: qty 2

## 2019-04-05 NOTE — ED Notes (Signed)
ED TO INPATIENT HANDOFF REPORT  ED Nurse Name and Phone #: [phone]  S Name/Age/Gender Heather Henry 58 y.o. female Room/Bed: ED03A/ED03A  Code Status   Code Status: Not on file  Home/SNF/Other Home Patient oriented to: self, place, time and situation Is this baseline? Yes   Triage Complete: Triage complete  Chief Complaint vomting  Triage Note Says she has been sickd since Friday.  Days she has cough.  Also has had diarrhea and vomiting--that is better, but she has cramping all over--chest, muscles, abdomen.   Allergies No Known Allergies  Level of Care/Admitting Diagnosis ED Disposition    ED Disposition Condition Comment   Admit  Hospital Area: [facility] [[id number]]  Level of Care: Med-Surg [16]  Covid Evaluation: Confirmed COVID Negative  Diagnosis: Rhabdomyolysis [728.88.ICD-9-CM]  Admitting Physician: Shaune Pollack [[id number]]  Attending Physician: Shaune Pollack [[id number]]  Estimated length of stay: past midnight tomorrow  Certification:: I certify this patient will need inpatient services for at least 2 midnights  PT Class (Do Not Modify): Inpatient [101]  PT Acc Code (Do Not Modify): Private [1]       B Medical/Surgery History History reviewed. No pertinent past medical history. Past Surgical History:  Procedure Laterality Date  . JOINT REPLACEMENT    . KNEE ARTHROSCOPY Right      A IV Location/Drains/Wounds Patient Lines/Drains/Airways Status   Active Line/Drains/Airways    Name:   Placement date:   Placement time:   Site:   Days:   Peripheral IV 04/05/19 Right Antecubital   04/05/19    1220    Antecubital   less than 1          Intake/Output Last 24 hours  Intake/Output Summary (Last 24 hours) at 04/05/2019 1528 Last data filed at 04/05/2019 1327 Gross per 24 hour  Intake 1000 ml  Output -  Net 1000 ml    Labs/Imaging Results for orders placed or performed during the hospital encounter of 04/05/19 (from the past 48  hour(s))  Comprehensive metabolic panel     Status: Abnormal   Collection Time: 04/05/19 11:44 AM  Result Value Ref Range   Sodium 132 (L) 135 - 145 mmol/L   Potassium 3.2 (L) 3.5 - 5.1 mmol/L   Chloride 93 (L) 98 - 111 mmol/L   CO2 26 22 - 32 mmol/L   Glucose, Bld 112 (H) 70 - 99 mg/dL   BUN 50 (H) 6 - 20 mg/dL   Creatinine, Ser 4.66 (H) 0.44 - 1.00 mg/dL   Calcium 9.2 8.9 - 59.9 mg/dL   Total Protein 7.7 6.5 - 8.1 g/dL   Albumin 4.3 3.5 - 5.0 g/dL   AST 42 (H) 15 - 41 U/L   ALT 31 0 - 44 U/L   Alkaline Phosphatase 86 38 - 126 U/L   Total Bilirubin 2.1 (H) 0.3 - 1.2 mg/dL   GFR calc non Af Amer 28 (L) >60 mL/min   GFR calc Af Amer 33 (L) >60 mL/min   Anion gap 13 5 - 15    Comment: Performed at [facility], [address] [place], [place] [address]  Lipase, blood     Status: None   Collection Time: 04/05/19 11:44 AM  Result Value Ref Range   Lipase 30 11 - 51 U/L    Comment: Performed at [facility], [address] [place], [place] [address]  CBC with Differential     Status: Abnormal   Collection Time: 04/05/19 11:44 AM  Result Value Ref Range   WBC 8.3 4.0 - 10.5 K/uL   RBC 5.39 (H) 3.87 - 5.11 MIL/uL   Hemoglobin 16.1 (H) 12.0 - 15.0 g/dL   HCT 16.148.0 (H) 09.636.0 - 04.546.0 %   MCV 89.1 80.0 - 100.0 fL   MCH 29.9 26.0 - 34.0 pg   MCHC 33.5 30.0 - 36.0 g/dL   RDW 40.913.2 81.111.5 - 91.415.5 %   Platelets 322 150 - 400 K/uL   nRBC 0.0 0.0 - 0.2 %   Neutrophils Relative % 56 %   Neutro Abs 4.6 1.7 - 7.7 K/uL   Lymphocytes Relative 35 %   Lymphs Abs 2.9 0.7 - 4.0 K/uL   Monocytes Relative 9 %   Monocytes Absolute 0.8 0.1 - 1.0 K/uL   Eosinophils Relative 0 %   Eosinophils Absolute 0.0 0.0 - 0.5 K/uL   Basophils Relative 0 %   Basophils Absolute 0.0 0.0 - 0.1 K/uL   Immature Granulocytes 0 %   Abs Immature Granulocytes 0.03 0.00 - 0.07 K/uL    Comment: Performed at [facility], [address] [place], [place] [address]  CK     Status: Abnormal    Collection Time: 04/05/19 11:44 AM  Result Value Ref Range   Total CK 3,035 (H) 38 - 234 U/L    Comment: Performed at [facility], [address] [place], [place] [address]  SARS Coronavirus 2 [facility](Hospital order, Performed in [facility] hospital lab) Nasopharyngeal Nasopharyngeal Swab     Status: None   Collection Time: 04/05/19 12:26 PM   Specimen: Nasopharyngeal Swab  Result Value Ref Range   SARS Coronavirus 2 NEGATIVE NEGATIVE    Comment: (NOTE) If result is NEGATIVE SARS-CoV-2 target nucleic acids are NOT DETECTED. The SARS-CoV-2 RNA is generally detectable in upper and lower  respiratory specimens during the acute phase of infection. The lowest  concentration of SARS-CoV-2 viral copies this assay can detect is 250  copies / mL. A negative result does not preclude SARS-CoV-2 infection  and should not be used as the sole basis for treatment or other  patient management decisions.  A negative result may occur with  improper specimen collection / handling, submission of specimen other  than nasopharyngeal swab, presence of viral mutation(s) within the  areas targeted by this assay, and inadequate number of viral copies  (<250 copies / mL). A negative result must be combined with clinical  observations, patient history, and epidemiological information. If result is POSITIVE SARS-CoV-2 target nucleic acids are DETECTED. The SARS-CoV-2 RNA is generally detectable in upper and lower  respiratory specimens dur ing the acute phase of infection.  Positive  results are indicative of active infection with SARS-CoV-2.  Clinical  correlation with patient history and other diagnostic information is  necessary to determine patient infection status.  Positive results do  not rule out bacterial infection or co-infection with other viruses. If result is PRESUMPTIVE POSTIVE SARS-CoV-2 nucleic acids MAY BE PRESENT.   A presumptive positive result was obtained on the submitted specimen   and confirmed on repeat testing.  While 2019 novel coronavirus  (SARS-CoV-2) nucleic acids may be present in the submitted sample  additional confirmatory testing may be necessary for epidemiological  and / or clinical management purposes  to differentiate between  SARS-CoV-2 and other Sarbecovirus currently known to infect humans.  If clinically indicated additional testing with an alternate test  methodology [phone]([id number]) is advised. The SARS-CoV-2 RNA is generally  detectable in upper  and lower respiratory sp ecimens during the acute  phase of infection. The expected result is Negative. Fact Sheet for Patients:  StrictlyIdeas.no Fact Sheet for Healthcare Providers: BankingDealers.co.za This test is not yet approved or cleared by the [place] FDA and has been authorized for detection and/or diagnosis of SARS-CoV-2 by FDA under an Emergency Use Authorization (EUA).  This EUA will remain in effect (meaning this test can be used) for the duration of the COVID-19 declaration under Section 564(b)(1) of the Act, 21 U.S.C. section 360bbb-3(b)(1), unless the authorization is terminated or revoked sooner. Performed at [facility], [address] [place], [place] [address]   Urinalysis, Complete w Microscopic     Status: Abnormal   Collection Time: 04/05/19  2:16 PM  Result Value Ref Range   Color, Urine YELLOW (A) YELLOW   APPearance CLEAR (A) CLEAR   Specific Gravity, Urine 1.011 1.005 - 1.030   pH 5.0 5.0 - 8.0   Glucose, UA NEGATIVE NEGATIVE mg/dL   Hgb urine dipstick SMALL (A) NEGATIVE   Bilirubin Urine NEGATIVE NEGATIVE   Ketones, ur NEGATIVE NEGATIVE mg/dL   Protein, ur NEGATIVE NEGATIVE mg/dL   Nitrite NEGATIVE NEGATIVE   Leukocytes,Ua NEGATIVE NEGATIVE   RBC / HPF 0-5 0 - 5 RBC/hpf   WBC, UA 0-5 0 - 5 WBC/hpf   Bacteria, UA RARE (A) NONE SEEN   Squamous Epithelial / LPF 0-5 0 - 5   Mucus PRESENT     Comment:  Performed at [facility], [address], [place], [place] [address]  Urine Drug Screen, Qualitative     Status: Abnormal   Collection Time: 04/05/19  2:16 PM  Result Value Ref Range   Tricyclic, Ur Screen NONE DETECTED NONE DETECTED   Amphetamines, Ur Screen NONE DETECTED NONE DETECTED   MDMA (Ecstasy)Ur Screen NONE DETECTED NONE DETECTED   Cocaine Metabolite,Ur [place] NONE DETECTED NONE DETECTED   Opiate, Ur Screen POSITIVE (A) NONE DETECTED   Phencyclidine (PCP) Ur S NONE DETECTED NONE DETECTED   Cannabinoid 50 Ng, Ur [place] POSITIVE (A) NONE DETECTED   Barbiturates, Ur Screen NONE DETECTED NONE DETECTED   Benzodiazepine, Ur Scrn NONE DETECTED NONE DETECTED   Methadone Scn, Ur NONE DETECTED NONE DETECTED    Comment: (NOTE) Tricyclics + metabolites, urine    Cutoff 1000 ng/mL Amphetamines + metabolites, urine  Cutoff 1000 ng/mL MDMA (Ecstasy), urine              Cutoff 500 ng/mL Cocaine Metabolite, urine          Cutoff 300 ng/mL Opiate + metabolites, urine        Cutoff 300 ng/mL Phencyclidine (PCP), urine         Cutoff 25 ng/mL Cannabinoid, urine                 Cutoff 50 ng/mL Barbiturates + metabolites, urine  Cutoff 200 ng/mL Benzodiazepine, urine              Cutoff 200 ng/mL Methadone, urine                   Cutoff 300 ng/mL The urine drug screen provides only a preliminary, unconfirmed analytical test result and should not be used for non-medical purposes. Clinical consideration and professional judgment should be applied to any positive drug screen result due to possible interfering substances. A more specific alternate chemical method must be used in order to obtain a confirmed analytical result. Gas chromatography / mass  spectrometry (GC/MS) is the preferred confirmat ory method. Performed at [facility], [address] [place], [place] [address]    Dg Chest ChildressPort 1 View  Result Date: 04/05/2019 CLINICAL DATA:  Shortness of breath, sick for 5 days  EXAM: PORTABLE CHEST 1 VIEW COMPARISON:  Radiograph 09/26/2017 FINDINGS: Prominent nipple shadow projects over the periphery of the right mid lung. No consolidation, features of edema, pneumothorax, or effusion. Pulmonary vascularity is normally distributed. The cardiomediastinal contours are unremarkable. No acute osseous or soft tissue abnormality. IMPRESSION: No acute cardiopulmonary abnormality. Electronically Signed   By: Kreg ShropshirePrice  DeHay M.D.   On: 04/05/2019 15:10    Pending Labs [employer]ed Labs (From admission, onward)    Start     Ordered   Signed and Held  Creatinine, serum  (heparin)  Once,   R    Comments: Baseline for heparin therapy IF NOT ALREADY DRAWN.    Signed and Held   Signed and Held  HIV antibody (Routine Testing)  Once,   R     Signed and Held   Signed and Armed forces training and education officerHeld  Basic metabolic panel  Tomorrow morning,   R     Signed and Held   Signed and Held  CBC  Tomorrow morning,   R     Signed and Held   Signed and Held  Magnesium  Add-on,   R     Signed and Held   Signed and Held  CK  Tomorrow morning,   R     Signed and Held   Signed and Held  Bilirubin, total  Tomorrow morning,   R     Signed and Held          Vitals/Pain Today's Vitals   04/05/19 1130 04/05/19 1215 04/05/19 1300 04/05/19 1330  BP: 90/66  109/77 (!) 127/97  Pulse:  71    Resp: 19 18 17 14   Temp:      TempSrc:      SpO2:  95%    Weight:      Height:      PainSc:        Isolation Precautions No active isolations  Medications Medications  sodium chloride 0.9 % bolus 1,000 mL (0 mLs Intravenous Stopped 04/05/19 1327)  ondansetron (ZOFRAN) injection 4 mg (4 mg Intravenous Given 04/05/19 1225)  sodium chloride 0.9 % bolus 1,000 mL (1,000 mLs Intravenous New Bag/Given 04/05/19 1327)  acetaminophen (TYLENOL) tablet 1,000 mg (1,000 mg Oral Given 04/05/19 1415)    Mobility walks Low fall risk   Focused Assessments Pulmonary Assessment Handoff:  Lung sounds: Bilateral Breath Sounds: Clear O2  Device: Room Air        R Recommendations: See Admitting Provider Note  Report given to:   Additional Notes:

## 2019-04-05 NOTE — Discharge Summary (Signed)
   [place] [facility] [place] NAME: Heather Henry    MR#:  [id number]  DATE OF BIRTH:  1961-01-20  DATE OF ADMISSION:  04/05/2019   ADMITTING PHYSICIAN: Demetrios Loll, MD  DATE OF DISCHARGE: 04/05/2019  5:09 PM  PRIMARY CARE PHYSICIAN: Patient, No Pcp Per   ADMISSION DIAGNOSIS:  Dehydration [E86.0] SOB (shortness of breath) [R06.02] Elevated CK [R74.8] AKI (acute kidney injury) ([place]) [N17.9] DISCHARGE DIAGNOSIS:  Active Problems:   Rhabdomyolysis  SECONDARY DIAGNOSIS:  History reviewed. No pertinent past medical history. HOSPITAL COURSE:  Acute rhabdomyolysis and acute renal failure due to dehydration The patient will be admitted to the medical floor. Start normal saline IV, follow-up CK and BMP.  Hyponatremia.  Normal saline IV and follow-up sodium level. Hypokalemia.  Potassium supplement and follow-up level and magnesium level.  Elevated bilirubin.  Unclear etiology, repeat bilirubin, abdominal ultrasound if no improvement. Tobacco abuse.  Smoking cessation was counseled for 3 to 4 minutes.  The patient was upset about the visitor policy when she was on the floor and left AMA.  Demetrios Loll M.D on 04/05/2019 at 6:15 PM  Between 7am to 6pm - Pager - [phone]  After 6pm go to [email or web address] - Proofreader  [facility]  Office  [phone]  CC: Primary care physician; Patient, No Pcp Per   Note: This dictation was prepared with Dragon dictation along with smaller phrase technology. Any transcriptional errors that result from this process are unintentional.

## 2019-04-05 NOTE — H&P (Addendum)
[facility] [place] [facility]   PATIENT NAME: Heather RoyalsJacquelyn Henry    MR#:  [id number]  DATE OF BIRTH:  07/14/1961  DATE OF ADMISSION:  04/05/2019  PRIMARY CARE PHYSICIAN: Patient, No Pcp Per   REQUESTING/REFERRING PHYSICIAN: Dr. Mort SawyersMonk  CHIEF COMPLAINT:   Chief Complaint  Patient presents with  . Emesis  . Diarrhea  . Spasms   Nausea, vomiting and diarrhea for 3 days. HISTORY OF PRESENT ILLNESS:  Heather RoyalsJacquelyn Stidd  is a 58 y.o. female with no past medical history presents the ED with above chief complaint.  The patient has had a nausea, vomiting and diarrhea for the past 3 days.  She denies any fever or chills, no dysuria or hematuria.  She complains of cramps.  She is found elevated CK more than 3000 and acute renal failure.  ED physician request admission. PAST MEDICAL HISTORY:  History reviewed. No pertinent past medical history.  No past medical history.  PAST SURGICAL HISTORY:   Past Surgical History:  Procedure Laterality Date  . JOINT REPLACEMENT    . KNEE ARTHROSCOPY Right     SOCIAL HISTORY:   Social History   Tobacco Use  . Smoking status: Current Every Day Smoker    Packs/day: 0.50    Years: 40.00    Pack years: 20.00    Types: Cigarettes  . Smokeless tobacco: Never Used  Substance Use Topics  . Alcohol use: Yes    Comment: rarely    FAMILY HISTORY:  No family history on file.  DRUG ALLERGIES:  No Known Allergies  REVIEW OF SYSTEMS:   Review of Systems  Constitutional: Positive for malaise/fatigue. Negative for chills and fever.  HENT: Negative for sore throat.   Eyes: Negative for blurred vision and double vision.  Respiratory: Negative for cough, hemoptysis, shortness of breath, wheezing and stridor.   Cardiovascular: Negative for chest pain, palpitations, orthopnea and leg swelling.  Gastrointestinal: Positive for diarrhea, nausea and vomiting. Negative for abdominal pain, blood in stool and melena.  Genitourinary:  Negative for dysuria, flank pain and hematuria.  Musculoskeletal: Negative for back pain and joint pain.       Muscle cramp.  Neurological: Negative for dizziness, sensory change, focal weakness, seizures, loss of consciousness, weakness and headaches.  Endo/Heme/Allergies: Negative for polydipsia.  Psychiatric/Behavioral: Negative for depression. The patient is not nervous/anxious.     MEDICATIONS AT HOME:   Prior to Admission medications   Medication Sig Start Date End Date Taking? Authorizing Provider  albuterol (PROVENTIL HFA;VENTOLIN HFA) 108 (90 Base) MCG/ACT inhaler Inhale 2 puffs into the lungs every 6 (six) hours as needed for wheezing or shortness of breath. 10/04/16   Rebecka ApleyWebster, Allison P, MD  benzonatate (TESSALON PERLES) 100 MG capsule Take 1 capsule (100 mg total) by mouth every 6 (six) hours as needed for cough. Patient not taking: Reported on 09/26/2017 10/04/16   Rebecka ApleyWebster, Allison P, MD  chlorpheniramine-HYDROcodone [facility](TUSSIONEX PENNKINETIC ER) 10-8 MG/5ML SUER Take 5 mLs by mouth every 12 (twelve) hours as needed. Patient not taking: Reported on 10/04/2016 09/23/16   Darci CurrentBrown, [place] N, MD  HYDROcodone-acetaminophen [facility](NORCO) 5-325 MG tablet Take 1 tablet by mouth every 6 (six) hours as needed for moderate pain. Patient not taking: Reported on 04/05/2019 10/19/17   Evon SlackGaines, Thomas C, PA-C  oxyCODONE-acetaminophen (PERCOCET) 5-325 MG tablet Take 1-2 tablets by mouth every 8 (eight) hours as needed. Patient not taking: Reported on 04/05/2019 09/26/17   Emily FilbertWilliams, Jonathan E, MD  predniSONE (DELTASONE) 20 MG  tablet Take 3 tablets (60 mg total) by mouth daily. Patient not taking: Reported on 09/26/2017 10/04/16   Loney Hering, MD  traMADol (ULTRAM) 50 MG tablet Take 1 tablet (50 mg total) by mouth every 6 (six) hours as needed. Patient not taking: Reported on 10/04/2016 01/31/15   Duanne Guess, PA-C      VITAL SIGNS:  Blood pressure (!) 127/97, pulse 71, temperature 98.1 F (36.7 C),  temperature source Oral, resp. rate 14, height 5\' 7"  (1.702 m), weight 68 kg, SpO2 95 %.  PHYSICAL EXAMINATION:  Physical Exam  GENERAL:  57 y.o.-year-old patient lying in the bed with no acute distress.  EYES: Pupils equal, round, reactive to light and accommodation. No scleral icterus. Extraocular muscles intact.  HEENT: Head atraumatic, normocephalic. Oropharynx and nasopharynx clear.  NECK:  Supple, no jugular venous distention. No thyroid enlargement, no tenderness.  LUNGS: Normal breath sounds bilaterally, no wheezing, rales,rhonchi or crepitation. No use of accessory muscles of respiration.  CARDIOVASCULAR: S1, S2 normal. No murmurs, rubs, or gallops.  ABDOMEN: Soft, nontender, nondistended. Bowel sounds present. No organomegaly or mass.  EXTREMITIES: No pedal edema, cyanosis, or clubbing.  NEUROLOGIC: Cranial nerves II through XII are intact. Muscle strength 5/5 in all extremities. Sensation intact. Gait not checked.  PSYCHIATRIC: The patient is alert and oriented x 3.  SKIN: No obvious rash, lesion, or ulcer.   LABORATORY PANEL:   CBC Recent Labs  Lab 04/05/19 1144  WBC 8.3  HGB 16.1*  HCT 48.0*  PLT 322   ------------------------------------------------------------------------------------------------------------------  Chemistries  Recent Labs  Lab 04/05/19 1144  NA 132*  K 3.2*  CL 93*  CO2 26  GLUCOSE 112*  BUN 50*  CREATININE 1.95*  CALCIUM 9.2  AST 42*  ALT 31  ALKPHOS 86  BILITOT 2.1*   ------------------------------------------------------------------------------------------------------------------  Cardiac Enzymes No results for input(s): TROPONINI in the last 168 hours. ------------------------------------------------------------------------------------------------------------------  RADIOLOGY:  No results found.    IMPRESSION AND PLAN:   Acute rhabdomyolysis and acute renal failure due to dehydration The patient will be admitted to the  medical floor. Start normal saline IV, follow-up CK and BMP.  Hyponatremia.  Normal saline IV and follow-up sodium level. Hypokalemia.  Potassium supplement and follow-up level and magnesium level.  Elevated bilirubin.  Unclear etiology, repeat bilirubin, abdominal ultrasound if no improvement. Tobacco abuse.  Smoking cessation was counseled for 3 to 4 minutes.  All the records are reviewed and case discussed with ED provider. Management plans discussed with the patient, family and they are in agreement.  CODE STATUS: Full code.  TOTAL TIME TAKING CARE OF THIS PATIENT:  minutes.    Demetrios Loll M.D on 04/05/2019 at 2:34 PM  Between 7am to 6pm - Pager - [phone]  After 6pm go to [email or web address] - Proofreader  [facility]  Office  [phone]  CC: Primary care physician; Patient, No Pcp Per   Note: This dictation was prepared with Dragon dictation along with smaller phrase technology. Any transcriptional errors that result from this process are unin

## 2019-04-05 NOTE — ED Notes (Signed)
Pt alert.  Iv in place.  Pt waiting on admission.

## 2019-04-05 NOTE — ED Provider Notes (Signed)
[facility] Emergency Department Provider Note  ____________________________________________   First MD Initiated Contact with Patient 04/05/19 1118     (approximate)  I have reviewed the triage vital signs and the nursing notes.  History  Chief Complaint Emesis, Diarrhea, and Spasms    HPI Heather Henry is a 58 y.o. female who presents to the emergency department for multiple episodes of vomiting, diarrhea for almost 1 week.  She has had difficulty staying hydrated.  She reports muscle cramping all over.  She reports a coughing discomfort with episodes of vomiting.  No fevers or sick contacts.  No recent travel or antibiotics.         Past Medical Hx History reviewed. No pertinent past medical history.  Problem List There are no active problems to display for this patient.   Past Surgical Hx Past Surgical History:  Procedure Laterality Date  . JOINT REPLACEMENT    . KNEE ARTHROSCOPY Right     Medications Prior to Admission medications   Medication Sig Start Date End Date Taking? Authorizing Provider  albuterol (PROVENTIL HFA;VENTOLIN HFA) 108 (90 Base) MCG/ACT inhaler Inhale 2 puffs into the lungs every 6 (six) hours as needed for wheezing or shortness of breath. 10/04/16   Loney Hering, MD  benzonatate (TESSALON PERLES) 100 MG capsule Take 1 capsule (100 mg total) by mouth every 6 (six) hours as needed for cough. Patient not taking: Reported on 09/26/2017 10/04/16   Loney Hering, MD  chlorpheniramine-HYDROcodone [facility]) 10-8 MG/5ML SUER Take 5 mLs by mouth every 12 (twelve) hours as needed. Patient not taking: Reported on 10/04/2016 09/23/16   Gregor Hams, MD  HYDROcodone-acetaminophen [facility]) 5-325 MG tablet Take 1 tablet by mouth every 6 (six) hours as needed for moderate pain. Patient not taking: Reported on 04/05/2019 10/19/17   Duanne Guess, PA-C  oxyCODONE-acetaminophen (PERCOCET) 5-325 MG tablet  Take 1-2 tablets by mouth every 8 (eight) hours as needed. Patient not taking: Reported on 04/05/2019 09/26/17   Earleen Newport, MD  predniSONE (DELTASONE) 20 MG tablet Take 3 tablets (60 mg total) by mouth daily. Patient not taking: Reported on 09/26/2017 10/04/16   Loney Hering, MD  traMADol (ULTRAM) 50 MG tablet Take 1 tablet (50 mg total) by mouth every 6 (six) hours as needed. Patient not taking: Reported on 10/04/2016 01/31/15   Duanne Guess, PA-C    Allergies Patient has no known allergies.  Family Hx No family history on file.  Social Hx Social History   Tobacco Use  . Smoking status: Current Every Day Smoker    Packs/day: 0.50    Years: 40.00    Pack years: 20.00    Types: Cigarettes  . Smokeless tobacco: Never Used  Substance Use Topics  . Alcohol use: Yes    Comment: rarely  . Drug use: Yes    Types: Marijuana    Comment: last used yesterday     Review of Systems  Constitutional: Negative for fever. Negative for chills. Eyes: Negative for visual changes. ENT: Negative for sore throat. Cardiovascular: Negative for chest pain. Respiratory: Negative for shortness of breath. Gastrointestinal: + nausea, vomiting, diarrhea Genitourinary: Negative for dysuria. Musculoskeletal: + muscle cramping Skin: Negative for rash. Neurological: Negative for for headaches.   Physical Exam  Vital Signs: ED Triage Vitals  Enc Vitals Group     BP 04/05/19 1108 105/76     Pulse Rate 04/05/19 1105 83     Resp 04/05/19 1105  16     Temp 04/05/19 1105 98.1 F (36.7 C)     Temp Source 04/05/19 1105 Oral     SpO2 04/05/19 1105 97 %     Weight 04/05/19 1106 150 lb (68 kg)     Height 04/05/19 1106 5\' 7"  (1.702 m)     Head Circumference --      Peak Flow --      Pain Score 04/05/19 1105 8     Pain Loc --      Pain Edu? --      Excl. in GC? --     Constitutional: Alert and oriented.  Eyes: Conjunctivae clear. Sclera anicteric. Head: Normocephalic. Atraumatic.  Nose: No congestion. No rhinorrhea. Mouth/Throat: Mucous membranes are dry.  Neck: No stridor.   Cardiovascular: Normal rate, regular rhythm. No murmurs. Extremities well perfused. Respiratory: Normal respiratory effort.  Lungs CTAB. Gastrointestinal: Soft and non-tender to deep palpation in all 4 quadrants. No distention.  Musculoskeletal: No lower extremity edema. Neurologic:  Normal speech and language. No gross focal neurologic deficits are appreciated.  Skin: Skin is warm, dry and intact. No rash noted. Psychiatric: Mood and affect are appropriate for situation.  EKG  N/A   Procedures  Procedure(s) performed (including critical care):  Procedures   Initial Impression / Assessment and Plan / ED Course  58 y.o. female who presents to the ED for nausea, vomiting, diarrhea for approximately 1 week, now with associated diffuse muscle cramping and dehydration.  On exam, she appears mildly dehydrated, with dry mucous membranes.  Abdominal exam is reassuring, soft and nontender throughout.  Will obtain labs, fluids, symptom control and reassess.  Do not feel emergent abdominal imaging required at this time, given her reassuring exam.  Work-up reveals an AKI, as well as significantly elevated CK to 3000.  COVID is negative.  Will admit for further hydration.  Discussed with hospitalist.   Final Clinical Impression(s) / ED Diagnosis  Final diagnoses:  Dehydration  Elevated CK  SOB (shortness of breath)  AKI (acute kidney injury) (HCC)       Note:  This document was prepared using Dragon voice recognition software and may include unintentional dictation errors.   Miguel AschoffMonks, Sarah L., MD 04/05/19 [phone]

## 2019-04-05 NOTE — ED Notes (Signed)
Report called to shea rn floor nurse 

## 2019-04-05 NOTE — ED Notes (Signed)
IV intact in right AC.

## 2019-04-05 NOTE — ED Triage Notes (Signed)
Pt arrived to triage room with previous IV in place on right [facility]. Pt reports she left room 112 at 1709 today and signed AMA paperwork, was walked to ED parking lot. Pt sts she had a family member to pass and left due to. Pt admitted for dehydration, renal insufficiency and URI.

## 2019-04-05 NOTE — Progress Notes (Signed)
Patient admitted to floor. Nursing profile being completed.   Patient asking questions regarding visitor policy. Patent became upset when informed that the visitor present in room would be the only visitor allowed during this admission. Patient stated that she had family coming from out of town and that was unacceptable. Explained policy to patient, patient understood. She stated it was very important to see the family because they were coming down for a funeral and that she'd just leave and come back later because "she really wants to feel better."   AMA paper obtained. Patient escorted to ED parking lot.   Madlyn Frankel, RN

## 2019-04-05 NOTE — ED Triage Notes (Signed)
Says she has been sickd since Friday.  Days she has cough.  Also has had diarrhea and vomiting--that is better, but she has cramping all over--chest, muscles, abdomen.

## 2019-04-06 ENCOUNTER — Inpatient Hospital Stay
Admission: EM | Admit: 2019-04-06 | Discharge: 2019-04-07 | DRG: 683 | Disposition: A | Discharge status: Home / Self Care | Payer: Self-pay | Attending: Internal Medicine | Admitting: Internal Medicine

## 2019-04-06 ENCOUNTER — Inpatient Hospital Stay: Payer: Self-pay

## 2019-04-06 DIAGNOSIS — M6282 Rhabdomyolysis: Secondary | ICD-10-CM

## 2019-04-06 LAB — BASIC METABOLIC PANEL
Anion gap: 6 (ref 5–15)
BUN: 25 mg/dL — ABNORMAL HIGH (ref 6–20)
CO2: 24 mmol/L (ref 22–32)
Calcium: 7.6 mg/dL — ABNORMAL LOW (ref 8.9–10.3)
Chloride: 109 mmol/L (ref 98–111)
Creatinine, Ser: 1.08 mg/dL — ABNORMAL HIGH (ref 0.44–1.00)
GFR calc Af Amer: >60 mL/min (ref >60)
GFR calc non Af Amer: 57 mL/min — ABNORMAL LOW (ref >60)
Glucose, Bld: 93 mg/dL (ref 70–99)
Potassium: 3.3 mmol/L — ABNORMAL LOW (ref 3.5–5.1)
Sodium: 139 mmol/L (ref 135–145)

## 2019-04-06 LAB — BILIRUBIN, TOTAL: Total Bilirubin: 2 mg/dL — ABNORMAL HIGH (ref 0.3–1.2)

## 2019-04-06 LAB — COMPREHENSIVE METABOLIC PANEL
ALT: 36 U/L (ref 0–44)
AST: 57 U/L — ABNORMAL HIGH (ref 15–41)
Albumin: 4.3 g/dL (ref 3.5–5.0)
Alkaline Phosphatase: 87 U/L (ref 38–126)
Anion gap: 9 (ref 5–15)
BUN: 35 mg/dL — ABNORMAL HIGH (ref 6–20)
CO2: 25 mmol/L (ref 22–32)
Calcium: 8.9 mg/dL (ref 8.9–10.3)
Chloride: 100 mmol/L (ref 98–111)
Creatinine, Ser: 1.54 mg/dL — ABNORMAL HIGH (ref 0.44–1.00)
GFR calc Af Amer: 43 mL/min — ABNORMAL LOW (ref >60)
GFR calc non Af Amer: 37 mL/min — ABNORMAL LOW (ref >60)
Glucose, Bld: 128 mg/dL — ABNORMAL HIGH (ref 70–99)
Potassium: 3.5 mmol/L (ref 3.5–5.1)
Sodium: 134 mmol/L — ABNORMAL LOW (ref 135–145)
Total Bilirubin: 2.4 mg/dL — ABNORMAL HIGH (ref 0.3–1.2)
Total Protein: 7.2 g/dL (ref 6.5–8.1)

## 2019-04-06 LAB — CBC
HCT: 37.5 % (ref 36.0–46.0)
Hemoglobin: 12.4 g/dL (ref 12.0–15.0)
MCH: 30.2 pg (ref 26.0–34.0)
MCHC: 33.1 g/dL (ref 30.0–36.0)
MCV: 91.2 fL (ref 80.0–100.0)
Platelets: 193 10*3/uL (ref 150–400)
RBC: 4.11 MIL/uL (ref 3.87–5.11)
RDW: 13.2 % (ref 11.5–15.5)
WBC: 5.9 10*3/uL (ref 4.0–10.5)
nRBC: 0 % (ref 0.0–0.2)

## 2019-04-06 LAB — HEPATIC FUNCTION PANEL
ALT: 28 U/L (ref 0–44)
AST: 48 U/L — ABNORMAL HIGH (ref 15–41)
Albumin: 3.1 g/dL — ABNORMAL LOW (ref 3.5–5.0)
Alkaline Phosphatase: 70 U/L (ref 38–126)
Bilirubin, Direct: 0.3 mg/dL — ABNORMAL HIGH (ref 0.0–0.2)
Indirect Bilirubin: 1.6 mg/dL — ABNORMAL HIGH (ref 0.3–0.9)
Total Bilirubin: 1.9 mg/dL — ABNORMAL HIGH (ref 0.3–1.2)
Total Protein: 5.4 g/dL — ABNORMAL LOW (ref 6.5–8.1)

## 2019-04-06 LAB — CK
Total CK: 5586 U/L — ABNORMAL HIGH (ref 38–234)
Total CK: 3724 U/L — ABNORMAL HIGH (ref 38–234)

## 2019-04-06 LAB — TSH: TSH: 0.69 u[IU]/mL (ref 0.350–4.500)

## 2019-04-06 MED ORDER — ACETAMINOPHEN 650 MG RE SUPP
650.0000 mg | Freq: Four times a day (QID) | RECTAL | Status: DC | PRN
  Filled 2019-04-06: qty 1

## 2019-04-06 MED ORDER — SODIUM CHLORIDE 0.9 % IV SOLN
1000.0000 mL | INTRAVENOUS | Status: DC
  Administered 2019-04-06: 1000 mL via INTRAVENOUS

## 2019-04-06 MED ORDER — SODIUM CHLORIDE 0.9 % IV BOLUS (SEPSIS)
1000.0000 mL | Freq: Once | INTRAVENOUS | Status: AC
  Administered 2019-04-06: 1000 mL via INTRAVENOUS

## 2019-04-06 MED ORDER — ONDANSETRON HCL 4 MG/2ML IJ SOLN: 4.0000 mg | Freq: Four times a day (QID) | INTRAMUSCULAR | Status: DC | PRN

## 2019-04-06 MED ORDER — ONDANSETRON HCL 4 MG PO TABS
4.0000 mg | ORAL_TABLET | Freq: Four times a day (QID) | ORAL | Status: DC | PRN
  Filled 2019-04-06: qty 1

## 2019-04-06 MED ORDER — SODIUM CHLORIDE 0.9 % IV BOLUS (SEPSIS)
1000.0000 mL | Freq: Once | INTRAVENOUS | Status: AC
  Administered 2019-04-06: 04:00:00 1000 mL via INTRAVENOUS

## 2019-04-06 MED ORDER — GUAIFENESIN 100 MG/5ML PO SOLN
5.0000 mL | ORAL | Status: DC | PRN
  Filled 2019-04-06: qty 5

## 2019-04-06 MED ORDER — ENOXAPARIN SODIUM 40 MG/0.4ML ~~LOC~~ SOLN
40.0000 mg | SUBCUTANEOUS | Status: DC
  Administered 2019-04-06: 40 mg via SUBCUTANEOUS
  Filled 2019-04-06: qty 0.4

## 2019-04-06 MED ORDER — ACETAMINOPHEN 325 MG PO TABS: 650.0000 mg | ORAL_TABLET | Freq: Four times a day (QID) | ORAL | Status: DC | PRN

## 2019-04-06 MED ORDER — ALBUTEROL SULFATE HFA 108 (90 BASE) MCG/ACT IN AERS: 2.0000 | INHALATION_SPRAY | Freq: Four times a day (QID) | RESPIRATORY_TRACT | Status: DC | PRN

## 2019-04-06 MED ORDER — OXYCODONE-ACETAMINOPHEN 5-325 MG PO TABS
1.0000 | ORAL_TABLET | Freq: Three times a day (TID) | ORAL | Status: DC | PRN
  Administered 2019-04-06: 22:00:00 1 via ORAL
  Filled 2019-04-06: qty 1

## 2019-04-06 MED ORDER — SODIUM CHLORIDE 0.9 % IV SOLN
INTRAVENOUS | Status: DC
  Administered 2019-04-06 – 2019-04-07 (×4): via INTRAVENOUS

## 2019-04-06 MED ORDER — SODIUM CHLORIDE 0.9% FLUSH
3.0000 mL | Freq: Two times a day (BID) | INTRAVENOUS | Status: DC
  Administered 2019-04-06: 3 mL via INTRAVENOUS

## 2019-04-06 NOTE — ED Notes (Signed)
Called dietary and requested new breakfast due to pt not liking the first breakfast. Dietary gave options for the patient and we found something she wanted to eat.

## 2019-04-06 NOTE — ED Notes (Signed)
Ultrasond called and said they are on their way.

## 2019-04-06 NOTE — ED Notes (Signed)
ED TO INPATIENT HANDOFF REPORT  ED Nurse Name and Phone #: Victorino DikeJennifer [phone]  S Name/Age/Gender Heather RoyalsJacquelyn Neumeyer 58 y.o. female Room/Bed: ED34A/ED34A  Code Status   Code Status: Full Code  Home/SNF/Other Home Patient oriented to: self, place, time and situation Is this baseline? Yes   Triage Complete: Triage complete  Chief Complaint Admission  Triage Note Pt arrived to triage room with [facility]. Pt reports she left room 112 at 1709 today and signed AMA paperwork, was walked to ED parking lot. Pt sts she had a family member to pass and left due to. Pt admitted for dehydration, renal insufficiency and URI.    Allergies No Known Allergies  Level of Care/Admitting Diagnosis ED Disposition    ED Disposition Condition Comment   Admit  Hospital [facility] [[id number]]  Level of Care: Med-Surg [16]  Covid Evaluation: Confirmed COVID Negative  Diagnosis: Rhabdomyolysis [728.88.ICD-9-CM]  Admitting Physician: Hannah BeatMANSY, JAN A [[id number]]  Attending Physician: Hannah BeatMANSY, JAN A [[id number]]  Estimated length of stay: past midnight tomorrow  Certification:: I certify this patient will need inpatient services for at least 2 midnights  PT Class (Do Not Modify): Inpatient [101]  PT Acc Code (Do Not Modify): Private [1]       B Medical/Surgery History History reviewed. No pertinent past medical history. Past Surgical History:  Procedure Laterality Date  . JOINT REPLACEMENT    . KNEE ARTHROSCOPY Right      A IV Location/Drains/Wounds Patient Lines/Drains/Airways Status   Active Line/Drains/Airways    Name:   Placement date:   Placement time:   Site:   Days:   Peripheral IV 04/05/19 Right Antecubital   04/05/19    1220    Antecubital   1   Peripheral IV 04/06/19 Left Forearm   04/06/19    0328    Forearm   less than 1          Intake/Output Last 24 hours No intake or output data in the 24 hours ending 04/06/19  1439  Labs/Imaging Results for orders placed or performed during the hospital encounter of 04/06/19 (from the past 48 hour(s))  CK     Status: Abnormal   Collection Time: 04/05/19 11:06 PM  Result Value Ref Range   Total CK 5,586 (H) 38 - 234 U/L    Comment: RESULT CONFIRMED BY MANUAL DILUTION [facility] Performed at [facility], [address] [place], [place] [address]   Comprehensive metabolic panel     Status: Abnormal   Collection Time: 04/05/19 11:06 PM  Result Value Ref Range   Sodium 134 (L) 135 - 145 mmol/L   Potassium 3.5 3.5 - 5.1 mmol/L   Chloride 100 98 - 111 mmol/L   CO2 25 22 - 32 mmol/L   Glucose, Bld 128 (H) 70 - 99 mg/dL   BUN 35 (H) 6 - 20 mg/dL   Creatinine, Ser 2.531.54 (H) 0.44 - 1.00 mg/dL   Calcium 8.9 8.9 - 66.410.3 mg/dL   Total Protein 7.2 6.5 - 8.1 g/dL   Albumin 4.3 3.5 - 5.0 g/dL   AST 57 (H) 15 - 41 U/L   ALT 36 0 - 44 U/L   Alkaline Phosphatase 87 38 - 126 U/L   Total Bilirubin 2.4 (H) 0.3 - 1.2 mg/dL   GFR calc non Af Amer 37 (L) >60 mL/min   GFR calc Af Amer 43 (L) >60 mL/min   Anion gap 9 5 - 15  Comment: Performed at [facility], [address] [place], [place] [address]  Hepatic function panel     Status: Abnormal   Collection Time: 04/06/19  7:17 AM  Result Value Ref Range   Total Protein 5.4 (L) 6.5 - 8.1 g/dL   Albumin 3.1 (L) 3.5 - 5.0 g/dL   AST 48 (H) 15 - 41 U/L   ALT 28 0 - 44 U/L   Alkaline Phosphatase 70 38 - 126 U/L   Total Bilirubin 1.9 (H) 0.3 - 1.2 mg/dL   Bilirubin, Direct 0.3 (H) 0.0 - 0.2 mg/dL   Indirect Bilirubin 1.6 (H) 0.3 - 0.9 mg/dL    Comment: Performed at [facility], [address] [place], [place] [address]  TSH     Status: None   Collection Time: 04/06/19  7:17 AM  Result Value Ref Range   TSH 0.690 0.350 - 4.500 uIU/mL    Comment: Performed by a 3rd Generation assay with a functional sensitivity of <=0.01 uIU/mL. Performed at [facility], [address]  [place], [place] [address]   Basic metabolic panel     Status: Abnormal   Collection Time: 04/06/19  7:17 AM  Result Value Ref Range   Sodium 139 135 - 145 mmol/L   Potassium 3.3 (L) 3.5 - 5.1 mmol/L   Chloride 109 98 - 111 mmol/L   CO2 24 22 - 32 mmol/L   Glucose, Bld 93 70 - 99 mg/dL   BUN 25 (H) 6 - 20 mg/dL   Creatinine, Ser 1.53 (H) 0.44 - 1.00 mg/dL   Calcium 7.6 (L) 8.9 - 10.3 mg/dL   GFR calc non Af Amer 57 (L) >60 mL/min   GFR calc Af Amer >60 >60 mL/min   Anion gap 6 5 - 15    Comment: Performed at [facility], [address] [place], [place] [address]  CBC     Status: None   Collection Time: 04/06/19  7:17 AM  Result Value Ref Range   WBC 5.9 4.0 - 10.5 K/uL   RBC 4.11 3.87 - 5.11 MIL/uL   Hemoglobin 12.4 12.0 - 15.0 g/dL   HCT 76.1 47.0 - 92.9 %   MCV 91.2 80.0 - 100.0 fL   MCH 30.2 26.0 - 34.0 pg   MCHC 33.1 30.0 - 36.0 g/dL   RDW 57.4 73.4 - 03.7 %   Platelets 193 150 - 400 K/uL   nRBC 0.0 0.0 - 0.2 %    Comment: Performed at [facility], [address] [place], [place] [address]  CK     Status: Abnormal   Collection Time: 04/06/19  7:17 AM  Result Value Ref Range   Total CK 3,724 (H) 38 - 234 U/L    Comment: Performed at [facility], [address] [place], [place] [address]  Bilirubin, total     Status: Abnormal   Collection Time: 04/06/19  7:17 AM  Result Value Ref Range   Total Bilirubin 2.0 (H) 0.3 - 1.2 mg/dL    Comment: Performed at [facility], [address] [place], [place] [address]   Dg Chest Port 1 View  Result Date: 04/05/2019 CLINICAL DATA:  Shortness of breath, sick for 5 days EXAM: PORTABLE CHEST 1 VIEW COMPARISON:  Radiograph 09/26/2017 FINDINGS: Prominent nipple shadow projects over the periphery of the right mid lung. No consolidation, features of edema, pneumothorax, or effusion. Pulmonary vascularity is normally distributed. The cardiomediastinal contours are unremarkable. No acute osseous  or  soft tissue abnormality. IMPRESSION: No acute cardiopulmonary abnormality. Electronically Signed   By: Lovena Le M.D.   On: 04/05/2019 15:10   US Abdomen Limited Ruq  Result Date: 04/06/2019 CLINICAL DATA:  Elevated bilirubin.  Abdominal pain. EXAM: ULTRASOUND ABDOMEN LIMITED RIGHT UPPER QUADRANT COMPARISON:  CT 09/26/2017 FINDINGS: Gallbladder: No gallstones or wall thickening visualized. No sonographic Murphy sign noted by sonographer. Common bile duct: Diameter: Prominent common bile duct, diameter up 2 1.2 cm. No obstructing lesion is seen. Distal stone not excluded. Pancreatic head lesion not excluded. The duct has been prominent on the previous CT studies as well. Liver: No focal lesion identified. Within normal limits in parenchymal echogenicity. Portal vein is patent on color Doppler imaging with normal direction of blood flow towards the liver. Hemangioma seen by previous CT not specifically demonstrated. Other: None. IMPRESSION: No visible gallstones. Prominent common bile duct, measuring up to 12 mm. This is similar to the previous CT studies and is probably not significant. Cannot completely rule out a ductal stone or pancreatic head/ampullary lesion. 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 Electronically Signed   By: Nelson Chimes M.D.   On: 04/06/2019 11:34    Pending Labs Unresulted Labs (From admission, onward)    Start     Ordered   04/13/19 0500  Creatinine, serum  (enoxaparin (LOVENOX)    CrCl < 30 ml/min)  Weekly,   STAT    Comments: while on enoxaparin therapy.    04/06/19 0432   04/07/19 2951  Basic metabolic panel  Tomorrow morning,   STAT     04/06/19 0910   04/07/19 0500  CK  Tomorrow morning,   STAT     04/06/19 0910   04/06/19 0433  HIV antibody (Routine Testing)  Once,   STAT     04/06/19 0432          Vitals/Pain Today's Vitals   04/06/19 1015 04/06/19 1030 04/06/19 1045 04/06/19 1256  BP:   104/72  108/64  Pulse: 64 (!) 59 (!) 59 (!) 50  Resp: 12 10 18 16   Temp:      TempSrc:      SpO2: 90% 93% 95% 98%    Isolation Precautions No active isolations  Medications Medications  sodium chloride 0.9 % bolus 1,000 mL (0 mLs Intravenous Stopped 04/06/19 0409)    Followed by  sodium chloride 0.9 % bolus 1,000 mL (0 mLs Intravenous Stopped 04/06/19 0435)    Followed by  sodium chloride 0.9 % bolus 1,000 mL (0 mLs Intravenous Stopped 04/06/19 0454)    Followed by  0.9 %  sodium chloride infusion (0 mLs Intravenous Stopping Infusion hung by another clincian 04/06/19 0730)  enoxaparin (LOVENOX) injection 40 mg (40 mg Subcutaneous Given 04/06/19 0739)  sodium chloride flush (NS) 0.9 % injection 3 mL (3 mLs Intravenous Not Given 04/06/19 0921)  0.9 %  sodium chloride infusion ( Intravenous Stopping Infusion hung by another clincian 04/06/19 1313)  acetaminophen (TYLENOL) tablet 650 mg (has no administration in time range)    Or  acetaminophen (TYLENOL) suppository 650 mg (has no administration in time range)  ondansetron (ZOFRAN) tablet 4 mg (has no administration in time range)    Or  ondansetron (ZOFRAN) injection 4 mg (has no administration in time range)    Mobility walks Low fall risk   Focused Assessments Cardiac Assessment Handoff:    Lab Results  Component Value Date  CKTOTAL 3,724 (H) 04/06/2019   CKMB 1.5 09/02/2011   TROPONINI <0.03 09/26/2017   No results found for: DDIMER Does the Patient currently have chest pain? No     R Recommendations: See Admitting Provider Note  Report given to:   Additional Notes:

## 2019-04-06 NOTE — H&P (Signed)
[facility] - [place] [facility]   PATIENT NAME: Heather RoyalsJacquelyn Henry    MR#:  [id number]  DATE OF BIRTH:  08/05/1961  DATE OF ADMISSION:  04/06/2019  PRIMARY CARE PHYSICIAN: Patient, No Pcp Per   REQUESTING/REFERRING PHYSICIAN: Bayard Malesandolph Brown, MD  CHIEF COMPLAINT:   Chief Complaint  Patient presents with  . Dehydration    HISTORY OF PRESENT ILLNESS:  Heather Henry  is a 58 y.o. female who presented to the emergency room initially in the a.m. of 04/05/2019 with complaints of multiple episodes of nausea, vomiting, diarrhea persistent over the last week.  Patient had become unable to keep down even clear liquids.  At that time she was experiencing diffuse muscle cramping as well.  She denies having noted fevers or chills.  No dysuria.  No hematuria.  No chest pain.  She denies shortness of breath.  She denies hematemesis, hematochezia, or melena.  Urine drug screen was positive for marijuana and opiates.  Her creatinine was 1.54 on arrival with CK greater than 3000.  However the patient had unforeseen family problems and had to leave the hospital.  She has now returned with continued symptoms as listed above.  CK was repeated on her arrival to the emergency room and is now 5586.  Bilirubin is 2.4 with AST 57 and alkaline phosphatase 87.  Chest x-ray shows no acute pulmonary disease.  Urinalysis is negative.  We have admitted her to the hospital service for acute rhabdomyolysis with dehydration and acute kidney injury. PAST MEDICAL HISTORY:  History reviewed. No pertinent past medical history.  PAST SURGICAL HISTORY:   Past Surgical History:  Procedure Laterality Date  . JOINT REPLACEMENT    . KNEE ARTHROSCOPY Right     SOCIAL HISTORY:   Social History   Tobacco Use  . Smoking status: Current Every Day Smoker    Packs/day: 0.50    Years: 40.00    Pack years: 20.00    Types: Cigarettes  . Smokeless tobacco: Never Used  Substance Use Topics  .  Alcohol use: Yes    Comment: rarely    FAMILY HISTORY:  History reviewed. No pertinent family history.  DRUG ALLERGIES:  No Known Allergies  REVIEW OF SYSTEMS:   Review of Systems  Constitutional: Positive for malaise/fatigue. Negative for chills and fever.  HENT: Negative for congestion and sore throat.   Eyes: Negative for blurred vision and double vision.  Respiratory: Negative for cough and shortness of breath.   Cardiovascular: Negative for chest pain and palpitations.  Gastrointestinal: Positive for abdominal pain, diarrhea, nausea and vomiting. Negative for blood in stool, constipation and melena.  Genitourinary: Negative for dysuria, flank pain and hematuria.  Musculoskeletal: Negative for falls and myalgias.  Skin: Negative for itching and rash.  Neurological: Negative for dizziness, weakness and headaches.  Psychiatric/Behavioral: Negative for depression.     MEDICATIONS AT HOME:   Prior to Admission medications   Medication Sig Start Date End Date Taking? Authorizing Provider  albuterol (PROVENTIL HFA;VENTOLIN HFA) 108 (90 Base) MCG/ACT inhaler Inhale 2 puffs into the lungs every 6 (six) hours as needed for wheezing or shortness of breath. Patient not taking: Reported on 04/05/2019 10/04/16   Rebecka ApleyWebster, Allison P, MD  benzonatate (TESSALON PERLES) 100 MG capsule Take 1 capsule (100 mg total) by mouth every 6 (six) hours as needed for cough. Patient not taking: Reported on 09/26/2017 10/04/16   Rebecka ApleyWebster, Allison P, MD  chlorpheniramine-HYDROcodone [facility](TUSSIONEX PENNKINETIC ER) 10-8 MG/5ML SUER Take 5 mLs  by mouth every 12 (twelve) hours as needed. Patient not taking: Reported on 10/04/2016 09/23/16   Gregor Hams, MD  HYDROcodone-acetaminophen [facility]) 5-325 MG tablet Take 1 tablet by mouth every 6 (six) hours as needed for moderate pain. Patient not taking: Reported on 04/05/2019 10/19/17   Duanne Guess, PA-C  oxyCODONE-acetaminophen (PERCOCET) 5-325 MG tablet Take 1-2  tablets by mouth every 8 (eight) hours as needed. Patient not taking: Reported on 04/05/2019 09/26/17   Earleen Newport, MD  predniSONE (DELTASONE) 20 MG tablet Take 3 tablets (60 mg total) by mouth daily. Patient not taking: Reported on 09/26/2017 10/04/16   Loney Hering, MD  traMADol (ULTRAM) 50 MG tablet Take 1 tablet (50 mg total) by mouth every 6 (six) hours as needed. Patient not taking: Reported on 10/04/2016 01/31/15   Duanne Guess, PA-C      VITAL SIGNS:  Blood pressure 117/69, pulse 71, temperature 99.3 F (37.4 C), temperature source Oral, resp. rate 20, SpO2 94 %.  PHYSICAL EXAMINATION:  Physical Exam  GENERAL:  58 y.o.-year-old patient lying in the bed with no acute distress.  EYES: Pupils equal, round, reactive to light and accommodation. No scleral icterus. Extraocular muscles intact.  HEENT: Head atraumatic, normocephalic. Oropharynx and nasopharynx clear.  NECK:  Supple, no jugular venous distention. No thyroid enlargement, no tenderness.  LUNGS: Normal breath sounds bilaterally, no wheezing, rales,rhonchi or crepitation. No use of accessory muscles of respiration.  CARDIOVASCULAR: Regular rate and rhythm, S1, S2 normal. No murmurs, rubs, or gallops.  ABDOMEN: Soft, nondistended, nontender. Bowel sounds present. No organomegaly or mass.  EXTREMITIES: No pedal edema, cyanosis, or clubbing.  NEUROLOGIC: Cranial nerves II through XII are intact. Muscle strength 5/5 in all extremities. Sensation intact. Gait not checked.  PSYCHIATRIC: The patient is alert and oriented x 3.  Normal affect and good eye contact. SKIN: No obvious rash, lesion, or ulcer.   LABORATORY PANEL:   CBC Recent Labs  Lab 04/05/19 1144  WBC 8.3  HGB 16.1*  HCT 48.0*  PLT 322   ------------------------------------------------------------------------------------------------------------------  Chemistries  Recent Labs  Lab 04/05/19 1144 04/05/19 2306  NA 132* 134*  K 3.2* 3.5  CL  93* 100  CO2 26 25  GLUCOSE 112* 128*  BUN 50* 35*  CREATININE 1.95* 1.54*  CALCIUM 9.2 8.9  MG 2.5*  --   AST 42* 57*  ALT 31 36  ALKPHOS 86 87  BILITOT 2.1* 2.4*   ------------------------------------------------------------------------------------------------------------------  Cardiac Enzymes No results for input(s): TROPONINI in the last 168 hours. ------------------------------------------------------------------------------------------------------------------  RADIOLOGY:  Dg Chest Port 1 View  Result Date: 04/05/2019 CLINICAL DATA:  Shortness of breath, sick for 5 days EXAM: PORTABLE CHEST 1 VIEW COMPARISON:  Radiograph 09/26/2017 FINDINGS: Prominent nipple shadow projects over the periphery of the right mid lung. No consolidation, features of edema, pneumothorax, or effusion. Pulmonary vascularity is normally distributed. The cardiomediastinal contours are unremarkable. No acute osseous or soft tissue abnormality. IMPRESSION: No acute cardiopulmonary abnormality. Electronically Signed   By: Lovena Le M.D.   On: 04/05/2019 15:10      IMPRESSION AND PLAN:   1.  Acute rhabdomyolysis - Secondary to dehydration - Patient is receiving 3 L normal saline in the emergency room.  We will continue normal saline at 150 cc/h -We will repeat CK and BMP in the a.m. - Telemetry monitoring  2. acute renal failure - Secondary to dehydration and above rhabdomyolysis - IV fluid resuscitation and continued normal saline at 2 cc/h -  Repeat BMP in the a.m. and continue to monitor renal function closely  3.  Elevated bilirubin - We will continue to monitor bilirubin closely.  Will get LFTs in the a.m.  Will consider abdominal ultrasound if no improvement.  4.  Tobacco abuse - We have discussed the importance of tobacco cessation  5.  Dehydration - Patient is receiving IV fluid resuscitation -We will repeat BMP and CBC in the a.m.  DVT and PPI prophylaxis    All the records are  reviewed and case discussed with ED provider. The plan of care was discussed in details with the patient (and family). I answered all questions. The patient agreed to proceed with the above mentioned plan. Further management will depend upon hospital course.   CODE STATUS: Full code  TOTAL TIME TAKING CARE OF THIS PATIENT:45 minutes.    Milas Kocher Kaisyn Reinhold CRNPon 04/06/2019 at 4:33 AM  Pager - [phone]  After 6pm go to [email or web address] - Social research officer, government  [facility] [place] [facility]  Office  [phone]  CC: Primary care physician; Patient, No Pcp Per   Note: This dictation was prepared with Dragon dictation along with smaller phrase technology. Any transcriptional errors that result from this process are unintentional.

## 2019-04-06 NOTE — ED Notes (Signed)
Patient transported to Ultrasound 

## 2019-04-06 NOTE — ED Provider Notes (Signed)
[facility] Emergency Department Provider Note    First MD Initiated Contact with Patient 04/06/19 [phone]     (approximate)  I have reviewed the triage vital signs and the nursing notes.   HISTORY  Chief Complaint Dehydration    HPI Heather Henry is a 58 y.o. female returns to the emergency department after leaving the hospital today after being admitted yesterday's for rhabdomyolysis.  Patient states that she had a family member who died and as such she had to leave.  Patient now returns to complete treatment.  Denies any symptoms at present.  Laboratory data reviewed revealed that the patient had a CK of 3035 yesterday afternoon at 11:44 AM.  Patient does admit to decreased urinary output.     History reviewed. No pertinent past medical history.  Patient Active Problem List   Diagnosis Date Noted  . Rhabdomyolysis 04/05/2019    Past Surgical History:  Procedure Laterality Date  . JOINT REPLACEMENT    . KNEE ARTHROSCOPY Right     Prior to Admission medications   Medication Sig Start Date End Date Taking? Authorizing Provider  albuterol (PROVENTIL HFA;VENTOLIN HFA) 108 (90 Base) MCG/ACT inhaler Inhale 2 puffs into the lungs every 6 (six) hours as needed for wheezing or shortness of breath. Patient not taking: Reported on 04/05/2019 10/04/16   Rebecka ApleyWebster, Allison P, MD  benzonatate (TESSALON PERLES) 100 MG capsule Take 1 capsule (100 mg total) by mouth every 6 (six) hours as needed for cough. Patient not taking: Reported on 09/26/2017 10/04/16   Rebecka ApleyWebster, Allison P, MD  chlorpheniramine-HYDROcodone [facility](TUSSIONEX PENNKINETIC ER) 10-8 MG/5ML SUER Take 5 mLs by mouth every 12 (twelve) hours as needed. Patient not taking: Reported on 10/04/2016 09/23/16   Darci CurrentBrown, [place] N, MD  HYDROcodone-acetaminophen [facility](NORCO) 5-325 MG tablet Take 1 tablet by mouth every 6 (six) hours as needed for moderate pain. Patient not taking: Reported on 04/05/2019 10/19/17   Evon SlackGaines,  Thomas C, PA-C  oxyCODONE-acetaminophen (PERCOCET) 5-325 MG tablet Take 1-2 tablets by mouth every 8 (eight) hours as needed. Patient not taking: Reported on 04/05/2019 09/26/17   Emily FilbertWilliams, Jonathan E, MD  predniSONE (DELTASONE) 20 MG tablet Take 3 tablets (60 mg total) by mouth daily. Patient not taking: Reported on 09/26/2017 10/04/16   Rebecka ApleyWebster, Allison P, MD  traMADol (ULTRAM) 50 MG tablet Take 1 tablet (50 mg total) by mouth every 6 (six) hours as needed. Patient not taking: Reported on 10/04/2016 01/31/15   Evon SlackGaines, Thomas C, PA-C    Allergies Patient has no known allergies.  History reviewed. No pertinent family history.  Social History Social History   Tobacco Use  . Smoking status: Current Every Day Smoker    Packs/day: 0.50    Years: 40.00    Pack years: 20.00    Types: Cigarettes  . Smokeless tobacco: Never Used  Substance Use Topics  . Alcohol use: Yes    Comment: rarely  . Drug use: Yes    Types: Marijuana    Comment: last used yesterday    Review of Systems Constitutional: No fever/chills Eyes: No visual changes. ENT: No sore throat. Cardiovascular: Denies chest pain. Respiratory: Denies shortness of breath. Gastrointestinal: No abdominal pain.  No nausea, no vomiting.  No diarrhea.  No constipation. Genitourinary: Negative for dysuria. Musculoskeletal: Negative for neck pain.  Negative for back pain. Integumentary: Negative for rash. Neurological: Negative for headaches, focal weakness or numbness. ___________   PHYSICAL EXAM:  VITAL SIGNS: ED Triage Vitals [04/05/19 2258]  Enc  Vitals Group     BP 117/69     Pulse Rate 71     Resp 20     Temp 99.3 F (37.4 C)     Temp Source Oral     SpO2 94 %     Weight      Height      Head Circumference      Peak Flow      Pain Score      Pain Loc      Pain Edu?      Excl. in [place]?     Constitutional: Alert and oriented.  Eyes: Conjunctivae are normal.  Mouth/Throat: Mucous membranes are moist. Neck: No  stridor.  No meningeal signs.   Cardiovascular: Normal rate, regular rhythm. Good peripheral circulation. Grossly normal heart sounds. Respiratory: Normal respiratory effort.  No retractions. Gastrointestinal: Soft and nontender. No distention.  Musculoskeletal: No lower extremity tenderness nor edema. No gross deformities of extremities. Neurologic:  Normal speech and language. No gross focal neurologic deficits are appreciated.  Skin:  Skin is warm, dry and intact. Psychiatric: Mood and affect are normal. Speech and behavior are normal.  ____________________________________________   LABS (all labs ordered are listed, but only abnormal results are displayed)  Labs Reviewed  CK - Abnormal; Notable for the following components:      Result Value   Total CK 5,586 (*)    All other components within normal limits  COMPREHENSIVE METABOLIC PANEL - Abnormal; Notable for the following components:   Sodium 134 (*)    Glucose, Bld 128 (*)    BUN 35 (*)    Creatinine, Ser 1.54 (*)    AST 57 (*)    Total Bilirubin 2.4 (*)    GFR calc non Af Amer 37 (*)    GFR calc Af Amer 43 (*)    All other components within normal limits  HIV ANTIBODY (ROUTINE TESTING W REFLEX)  CBC  CREATININE, SERUM  HEPATIC FUNCTION PANEL  TSH  BASIC METABOLIC PANEL  CBC  CK  BILIRUBIN, TOTAL   _______________  RADIOLOGY I, Delafield N Toluwani Ruder, personally viewed and evaluated these images (plain radiographs) as part of my medical decision making, as well as reviewing the written report by the radiologist.   Official radiology report(s): Dg Chest Port 1 View  Result Date: 04/05/2019 CLINICAL DATA:  Shortness of breath, sick for 5 days EXAM: PORTABLE CHEST 1 VIEW COMPARISON:  Radiograph 09/26/2017 FINDINGS: Prominent nipple shadow projects over the periphery of the right mid lung. No consolidation, features of edema, pneumothorax, or effusion. Pulmonary vascularity is normally distributed. The cardiomediastinal  contours are unremarkable. No acute osseous or soft tissue abnormality. IMPRESSION: No acute cardiopulmonary abnormality. Electronically Signed   By: Lovena Le M.D.   On: 04/05/2019 15:10    Procedures   ____________________________________________   INITIAL IMPRESSION / MDM / [place] / ED COURSE  As part of my medical decision making, I reviewed the following data within the electronic MEDICAL RECORD NUMBER  58 year old female presented with above-stated history physical exam secondary to rhabdomyolysis.  Patient was admitted yesterday and left the hospital AMA secondary to a family emergency.  Patient now returns requesting treatment.  Patient's CK was 71 yesterday CK currently 5586.  Patient given 3 L IV normal saline.  Patient discussed with Dr. Sidney Ace for hospital admission further evaluation and management.       ____________________________________________  FINAL CLINICAL IMPRESSION(S) / ED DIAGNOSES  Final diagnoses:  Non-traumatic  rhabdomyolysis     MEDICATIONS GIVEN DURING THIS VISIT:  Medications  sodium chloride 0.9 % bolus 1,000 mL (0 mLs Intravenous Stopped 04/06/19 0409)    Followed by  sodium chloride 0.9 % bolus 1,000 mL (0 mLs Intravenous Stopped 04/06/19 0435)    Followed by  sodium chloride 0.9 % bolus 1,000 mL (0 mLs Intravenous Stopped 04/06/19 0454)    Followed by  0.9 %  sodium chloride infusion (has no administration in time range)  albuterol (VENTOLIN HFA) 108 (90 Base) MCG/ACT inhaler 2 puff (has no administration in time range)  enoxaparin (LOVENOX) injection 30 mg (has no administration in time range)  sodium chloride flush (NS) 0.9 % injection 3 mL (has no administration in time range)  0.9 %  sodium chloride infusion (has no administration in time range)  acetaminophen (TYLENOL) tablet 650 mg (has no administration in time range)    Or  acetaminophen (TYLENOL) suppository 650 mg (has no administration in time range)  ondansetron  (ZOFRAN) tablet 4 mg (has no administration in time range)    Or  ondansetron (ZOFRAN) injection 4 mg (has no administration in time range)     ED Discharge Orders    None      *Please note:  Elisabel Lesueur was evaluated in Emergency Department on 04/06/2019 for the symptoms described in the history of present illness. She was evaluated in the context of the global COVID-19 pandemic, which necessitated consideration that the patient might be at risk for infection with the SARS-CoV-2 virus that causes COVID-19. Institutional protocols and algorithms that pertain to the evaluation of patients at risk for COVID-19 are in a state of rapid change based on information released by regulatory bodies including the CDC and federal and state organizations. These policies and algorithms were followed during the patient's care in the ED.  Some ED evaluations and interventions may be delayed as a result of limited staffing during the pandemic.*  Note:  This document was prepared using Dragon voice recognition software and may include unintentional dictation errors.   Darci Current, MD 04/06/19 [phone]

## 2019-04-06 NOTE — ED Notes (Signed)
New breakfast tray given to the patient at this time.

## 2019-04-06 NOTE — ED Notes (Signed)
Pt distraught at bedside, crying and stating 'you should've seen how they treated me' referring to staff here yesterday and this morning. Talked to patient and calmed her down, pt feeling better at this point. Offered breakfast, ice water, and blankets at this time. Helped pt to toilet as well. Will continue to monitor and support.

## 2019-04-06 NOTE — ED Notes (Signed)
Lab called and said that pt's green top hemolyzed. Redrew lab and sent at this time.

## 2019-04-07 LAB — BASIC METABOLIC PANEL
Anion gap: 4 — ABNORMAL LOW (ref 5–15)
BUN: 18 mg/dL (ref 6–20)
CO2: 25 mmol/L (ref 22–32)
Calcium: 8.3 mg/dL — ABNORMAL LOW (ref 8.9–10.3)
Chloride: 113 mmol/L — ABNORMAL HIGH (ref 98–111)
Creatinine, Ser: 0.9 mg/dL (ref 0.44–1.00)
GFR calc Af Amer: >60 mL/min (ref >60)
GFR calc non Af Amer: >60 mL/min (ref >60)
Glucose, Bld: 93 mg/dL (ref 70–99)
Potassium: 3.6 mmol/L (ref 3.5–5.1)
Sodium: 142 mmol/L (ref 135–145)

## 2019-04-07 LAB — HIV ANTIBODY (ROUTINE TESTING W REFLEX): HIV Screen 4th Generation wRfx: NONREACTIVE

## 2019-04-07 LAB — CK: Total CK: 3089 U/L — ABNORMAL HIGH (ref 38–234)

## 2019-04-07 NOTE — Progress Notes (Signed)
Discharge instructions reviewed with patient. Printed AVS and work note given to patient. Patient verbalizes understanding. 2 IV removed.

## 2019-04-07 NOTE — Discharge Summary (Signed)
[facility] NAME: Meli Faley    MR#:  [id number]  DATE OF BIRTH:  08-Nov-1960  DATE OF ADMISSION:  04/06/2019 ADMITTING PHYSICIAN: Christel Mormon, MD  DATE OF DISCHARGE: April 07, 2019  PRIMARY CARE PHYSICIAN: Patient, No Pcp Per    ADMISSION DIAGNOSIS:  Hyperbilirubinemia [E80.6] Non-traumatic rhabdomyolysis [M62.82]  DISCHARGE DIAGNOSIS:  Active Problems:   Rhabdomyolysis   SECONDARY DIAGNOSIS:  none  HOSPITAL COURSE:  58 year old female with tobacco dependence ho presented to the emergency room due to muscle cramping.  1.  Acute rhabdomyolysis: Patient symptoms have improved.  CK is improving.  Patient will continue hydration at home. 2.  Acute kidney injury in the setting of dehydration from nausea and vomiting.  Creatinine has improved with IV fluids.  3.Tobacco dependence: Patient is encouraged to quit smoking and willing to attempt to quit was assessed. Patient highly motivated.Counseling was provided for 4 minutes.     DISCHARGE CONDITIONS AND DIET:   Stable Regular diet  CONSULTS OBTAINED:    DRUG ALLERGIES:  No Known Allergies  DISCHARGE MEDICATIONS:   Allergies as of 04/07/2019   No Known Allergies     Medication List    STOP taking these medications   albuterol 108 (90 Base) MCG/ACT inhaler Commonly known as: VENTOLIN HFA   benzonatate 100 MG capsule Commonly known as: Tessalon Perles   chlorpheniramine-HYDROcodone 10-8 MG/5ML Suer Commonly known as: Tussionex Pennkinetic ER   HYDROcodone-acetaminophen 5-325 MG tablet Commonly known as: Norco   oxyCODONE-acetaminophen 5-325 MG tablet Commonly known as: Percocet   predniSONE 20 MG tablet Commonly known as: Deltasone   traMADol 50 MG tablet Commonly known as: Ultram         Today   CHIEF COMPLAINT:  Feeling better no muscle cramping   VITAL SIGNS:  Blood pressure 123/83, pulse (!) 54, temperature 98 F (36.7 C),  temperature source Oral, resp. rate 16, height 5\' 7"  (1.702 m), weight 64.6 kg, SpO2 99 %.   REVIEW OF SYSTEMS:  Review of Systems  Constitutional: Negative.  Negative for chills, fever and malaise/fatigue.  HENT: Negative.  Negative for ear discharge, ear pain, hearing loss, nosebleeds and sore throat.   Eyes: Negative.  Negative for blurred vision and pain.  Respiratory: Negative.  Negative for cough, hemoptysis, shortness of breath and wheezing.   Cardiovascular: Negative.  Negative for chest pain, palpitations and leg swelling.  Gastrointestinal: Negative.  Negative for abdominal pain, blood in stool, diarrhea, nausea and vomiting.  Genitourinary: Negative.  Negative for dysuria.  Musculoskeletal: Negative.  Negative for back pain.  Skin: Negative.   Neurological: Negative for dizziness, tremors, speech change, focal weakness, seizures and headaches.  Endo/Heme/Allergies: Negative.  Does not bruise/bleed easily.  Psychiatric/Behavioral: Negative.  Negative for depression, hallucinations and suicidal ideas.     PHYSICAL EXAMINATION:  GENERAL:  58 y.o.-year-old patient lying in the bed with no acute distress.  NECK:  Supple, no jugular venous distention. No thyroid enlargement, no tenderness.  LUNGS: Normal breath sounds bilaterally, no wheezing, rales,rhonchi  No use of accessory muscles of respiration.  CARDIOVASCULAR: S1, S2 normal. No murmurs, rubs, or gallops.  ABDOMEN: Soft, non-tender, non-distended. Bowel sounds present. No organomegaly or mass.  EXTREMITIES: No pedal edema, cyanosis, or clubbing.  PSYCHIATRIC: The patient is alert and oriented x 3.  SKIN: No obvious rash, lesion, or ulcer.   DATA REVIEW:   CBC Recent Labs  Lab 04/06/19 0717  WBC 5.9  HGB 12.4  HCT 37.5  PLT 193    Chemistries  Recent Labs  Lab 04/05/19 1144  04/06/19 0717 04/07/19 0544  NA 132*   < > 139 142  K 3.2*   < > 3.3* 3.6  CL 93*   < > 109 113*  CO2 26   < > 24 25  GLUCOSE 112*    < > 93 93  BUN 50*   < > 25* 18  CREATININE 1.95*   < > 1.08* 0.90  CALCIUM 9.2   < > 7.6* 8.3*  MG 2.5*  --   --   --   AST 42*   < > 48*  --   ALT 31   < > 28  --   ALKPHOS 86   < > 70  --   BILITOT 2.1*   < > 1.9*  2.0*  --    < > = values in this interval not displayed.    Cardiac Enzymes No results for input(s): TROPONINI in the last 168 hours.  Microbiology Results  @[email or web address] @  RADIOLOGY:  Dg Chest Port 1 View  Result Date: 04/05/2019 CLINICAL DATA:  Shortness of breath, sick for 5 days EXAM: PORTABLE CHEST 1 VIEW COMPARISON:  Radiograph 09/26/2017 FINDINGS: Prominent nipple shadow projects over the periphery of the right mid lung. No consolidation, features of edema, pneumothorax, or effusion. Pulmonary vascularity is normally distributed. The cardiomediastinal contours are unremarkable. No acute osseous or soft tissue abnormality. IMPRESSION: No acute cardiopulmonary abnormality. Electronically Signed   By: Kreg Shropshire M.D.   On: 04/05/2019 15:10   US Abdomen Limited Ruq  Result Date: 04/06/2019 CLINICAL DATA:  Elevated bilirubin.  Abdominal pain. EXAM: ULTRASOUND ABDOMEN LIMITED RIGHT UPPER QUADRANT COMPARISON:  CT 09/26/2017 FINDINGS: Gallbladder: No gallstones or wall thickening visualized. No sonographic Murphy sign noted by sonographer. Common bile duct: Diameter: Prominent common bile duct, diameter up 2 1.2 cm. No obstructing lesion is seen. Distal stone not excluded. Pancreatic head lesion not excluded. The duct has been prominent on the previous CT studies as well. Liver: No focal lesion identified. Within normal limits in parenchymal echogenicity. Portal vein is patent on color Doppler imaging with normal direction of blood flow towards the liver. Hemangioma seen by previous CT not specifically demonstrated. Other: None. IMPRESSION: No visible gallstones. Prominent common bile duct, measuring up to 12 mm. This is similar to the previous CT studies and is probably not  significant. Cannot completely rule out a ductal stone or pancreatic head/ampullary lesion. 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 Electronically Signed   By: Paulina Fusi M.D.   On: 04/06/2019 11:34      Allergies as of 04/07/2019   No Known Allergies     Medication List    STOP taking these medications   albuterol 108 (90 Base) MCG/ACT inhaler Commonly known as: VENTOLIN HFA   benzonatate 100 MG capsule Commonly known as: Tessalon Perles   chlorpheniramine-HYDROcodone 10-8 MG/5ML Suer Commonly known as: Tussionex Pennkinetic ER   HYDROcodone-acetaminophen 5-325 MG tablet Commonly known as: Norco   oxyCODONE-acetaminophen 5-325 MG tablet Commonly known as: Percocet   predniSONE 20 MG tablet Commonly known as: Deltasone   traMADol 50 MG tablet Commonly known as: Ultram         Management plans discussed with the patient and she is in agreement. Stable for discharge home  Patient should follow up with needs PCP  CODE STATUS:  Code Status Orders  (From admission, onward)         Start     Ordered   04/06/19 0433  Full code  Continuous     04/06/19 0432        Code Status History    Date Active Date Inactive Code Status Order ID Comments User Context   04/05/2019 1648 04/05/2019 2009 Full Code [id number]  Shaune Pollackhen, Qing, MD Inpatient   Advance Care Planning Activity      TOTAL TIME TAKING CARE OF THIS PATIENT: 38 minutes.    Note: This dictation was prepared with Dragon dictation along with smaller phrase technology. Any transcriptional errors that result from this process are unintentional.  Adrian SaranSital Shahin Knierim M.D on 04/07/2019 at 10:37 AM  Between 7am to 6pm - Pager - [phone] After 6pm go to [email or web address] - password [email or web address]  [facility]  Office  [phone]  CC: Primary care physician; Patient, No Pcp Per

## 2019-09-17 ENCOUNTER — Ambulatory Visit (LOCAL_COMMUNITY_HEALTH_CENTER): Payer: Self-pay

## 2019-09-17 ENCOUNTER — Other Ambulatory Visit: Payer: Self-pay

## 2019-09-17 DIAGNOSIS — Z111 Encounter for screening for respiratory tuberculosis: Secondary | ICD-10-CM

## 2019-09-20 ENCOUNTER — Ambulatory Visit (LOCAL_COMMUNITY_HEALTH_CENTER): Payer: Self-pay

## 2019-09-20 ENCOUNTER — Other Ambulatory Visit: Payer: Self-pay

## 2019-09-20 DIAGNOSIS — Z111 Encounter for screening for respiratory tuberculosis: Secondary | ICD-10-CM | POA: Insufficient documentation

## 2019-09-20 LAB — TB SKIN TEST
Induration: 0 mm
TB Skin Test: NEGATIVE

## 2020-12-23 ENCOUNTER — Encounter: Payer: Self-pay | Admitting: *Deleted

## 2020-12-23 ENCOUNTER — Other Ambulatory Visit: Payer: Self-pay

## 2020-12-23 DIAGNOSIS — Z833 Family history of diabetes mellitus: Secondary | ICD-10-CM

## 2020-12-23 DIAGNOSIS — Z20822 Contact with and (suspected) exposure to covid-19: Secondary | ICD-10-CM | POA: Diagnosis present

## 2020-12-23 DIAGNOSIS — E860 Dehydration: Secondary | ICD-10-CM | POA: Diagnosis present

## 2020-12-23 DIAGNOSIS — F17210 Nicotine dependence, cigarettes, uncomplicated: Secondary | ICD-10-CM | POA: Diagnosis present

## 2020-12-23 DIAGNOSIS — R748 Abnormal levels of other serum enzymes: Secondary | ICD-10-CM | POA: Diagnosis present

## 2020-12-23 DIAGNOSIS — E876 Hypokalemia: Secondary | ICD-10-CM | POA: Diagnosis present

## 2020-12-23 DIAGNOSIS — I160 Hypertensive urgency: Principal | ICD-10-CM | POA: Diagnosis present

## 2020-12-23 DIAGNOSIS — K529 Noninfective gastroenteritis and colitis, unspecified: Secondary | ICD-10-CM | POA: Diagnosis present

## 2020-12-23 LAB — COMPREHENSIVE METABOLIC PANEL
ALT: 22 U/L (ref 0–44)
AST: 24 U/L (ref 15–41)
Albumin: 4.9 g/dL (ref 3.5–5.0)
Alkaline Phosphatase: 115 U/L (ref 38–126)
Anion gap: 14 (ref 5–15)
BUN: 12 mg/dL (ref 6–20)
CO2: 24 mmol/L (ref 22–32)
Calcium: 10.1 mg/dL (ref 8.9–10.3)
Chloride: 98 mmol/L (ref 98–111)
Creatinine, Ser: 0.78 mg/dL (ref 0.44–1.00)
GFR, Estimated: >60 mL/min (ref >60)
Glucose, Bld: 185 mg/dL — ABNORMAL HIGH (ref 70–99)
Potassium: 4.3 mmol/L (ref 3.5–5.1)
Sodium: 136 mmol/L (ref 135–145)
Total Bilirubin: 1.5 mg/dL — ABNORMAL HIGH (ref 0.3–1.2)
Total Protein: 9.1 g/dL — ABNORMAL HIGH (ref 6.5–8.1)

## 2020-12-23 LAB — CBC
HCT: 45.8 % (ref 36.0–46.0)
Hemoglobin: 15.6 g/dL — ABNORMAL HIGH (ref 12.0–15.0)
MCH: 30 pg (ref 26.0–34.0)
MCHC: 34.1 g/dL (ref 30.0–36.0)
MCV: 88.1 fL (ref 80.0–100.0)
Platelets: 334 10*3/uL (ref 150–400)
RBC: 5.2 MIL/uL — ABNORMAL HIGH (ref 3.87–5.11)
RDW: 13.2 % (ref 11.5–15.5)
WBC: 10 10*3/uL (ref 4.0–10.5)
nRBC: 0 % (ref 0.0–0.2)

## 2020-12-23 LAB — LIPASE, BLOOD: Lipase: 26 U/L (ref 11–51)

## 2020-12-23 NOTE — ED Triage Notes (Signed)
Pt laying on bench in triage.  Pt states feeling bad since yesterday with a headache, pain in left rib area.  Pt reports vomiting   No diarrhea.  Pt alert

## 2020-12-23 NOTE — ED Notes (Signed)
Pt unable to void at this time. 

## 2020-12-24 ENCOUNTER — Observation Stay: Payer: Self-pay

## 2020-12-24 ENCOUNTER — Inpatient Hospital Stay
Admission: EM | Admit: 2020-12-24 | Discharge: 2020-12-26 | DRG: 305 | Disposition: A | Discharge status: Home / Self Care | Payer: Self-pay | Attending: Internal Medicine | Admitting: Internal Medicine

## 2020-12-24 ENCOUNTER — Encounter: Payer: Self-pay | Admitting: Internal Medicine

## 2020-12-24 ENCOUNTER — Emergency Department: Payer: Self-pay

## 2020-12-24 DIAGNOSIS — R197 Diarrhea, unspecified: Secondary | ICD-10-CM | POA: Diagnosis present

## 2020-12-24 DIAGNOSIS — R1012 Left upper quadrant pain: Secondary | ICD-10-CM

## 2020-12-24 DIAGNOSIS — R109 Unspecified abdominal pain: Secondary | ICD-10-CM | POA: Diagnosis present

## 2020-12-24 DIAGNOSIS — R519 Headache, unspecified: Secondary | ICD-10-CM

## 2020-12-24 DIAGNOSIS — R748 Abnormal levels of other serum enzymes: Secondary | ICD-10-CM | POA: Diagnosis present

## 2020-12-24 DIAGNOSIS — I160 Hypertensive urgency: Secondary | ICD-10-CM | POA: Diagnosis present

## 2020-12-24 DIAGNOSIS — R112 Nausea with vomiting, unspecified: Secondary | ICD-10-CM | POA: Diagnosis present

## 2020-12-24 HISTORY — DX: Tobacco use: Z72.0

## 2020-12-24 HISTORY — DX: Rhabdomyolysis: M62.82

## 2020-12-24 LAB — PROTIME-INR
INR: 1 (ref 0.8–1.2)
Prothrombin Time: 12.8 seconds (ref 11.4–15.2)

## 2020-12-24 LAB — URINALYSIS, COMPLETE (UACMP) WITH MICROSCOPIC
Bilirubin Urine: NEGATIVE
Glucose, UA: 50 mg/dL — AB
Ketones, ur: 5 mg/dL — AB
Leukocytes,Ua: NEGATIVE
Nitrite: NEGATIVE
Protein, ur: >=300 mg/dL — AB
Specific Gravity, Urine: 1.019 (ref 1.005–1.030)
pH: 6 (ref 5.0–8.0)

## 2020-12-24 LAB — TROPONIN I (HIGH SENSITIVITY): Troponin I (High Sensitivity): 10 ng/L (ref <18)

## 2020-12-24 LAB — CK: Total CK: 289 U/L — ABNORMAL HIGH (ref 38–234)

## 2020-12-24 LAB — HIV ANTIBODY (ROUTINE TESTING W REFLEX): HIV Screen 4th Generation wRfx: NONREACTIVE

## 2020-12-24 LAB — RESP PANEL BY RT-PCR (FLU A&B, COVID) ARPGX2
Influenza A by PCR: NEGATIVE
Influenza B by PCR: NEGATIVE
SARS Coronavirus 2 by RT PCR: NEGATIVE

## 2020-12-24 MED ORDER — METHOCARBAMOL 500 MG PO TABS
500.0000 mg | ORAL_TABLET | Freq: Three times a day (TID) | ORAL | Status: DC | PRN
  Administered 2020-12-24 – 2020-12-25 (×2): 500 mg via ORAL
  Filled 2020-12-24 (×5): qty 1

## 2020-12-24 MED ORDER — LABETALOL HCL 5 MG/ML IV SOLN
20.0000 mg | Freq: Once | INTRAVENOUS | Status: AC
  Administered 2020-12-24: 20 mg via INTRAVENOUS
  Filled 2020-12-24: qty 4

## 2020-12-24 MED ORDER — AMLODIPINE BESYLATE 5 MG PO TABS
5.0000 mg | ORAL_TABLET | Freq: Once | ORAL | Status: AC
  Administered 2020-12-24: 5 mg via ORAL
  Filled 2020-12-24: qty 1

## 2020-12-24 MED ORDER — HYDRALAZINE HCL 20 MG/ML IJ SOLN
5.0000 mg | INTRAMUSCULAR | Status: DC | PRN
  Administered 2020-12-24 – 2020-12-25 (×4): 5 mg via INTRAVENOUS
  Filled 2020-12-24 (×2): qty 0.25
  Filled 2020-12-24: qty 1
  Filled 2020-12-24: qty 0.25
  Filled 2020-12-24: qty 1

## 2020-12-24 MED ORDER — ENOXAPARIN SODIUM 40 MG/0.4ML IJ SOSY
40.0000 mg | PREFILLED_SYRINGE | INTRAMUSCULAR | Status: DC
  Administered 2020-12-24 – 2020-12-25 (×2): 40 mg via SUBCUTANEOUS
  Filled 2020-12-24 (×2): qty 0.4

## 2020-12-24 MED ORDER — LACTATED RINGERS IV SOLN: INTRAVENOUS | Status: DC

## 2020-12-24 MED ORDER — ONDANSETRON HCL 4 MG/2ML IJ SOLN: 4.0000 mg | Freq: Three times a day (TID) | INTRAMUSCULAR | Status: DC | PRN

## 2020-12-24 MED ORDER — ONDANSETRON HCL 4 MG/2ML IJ SOLN
4.0000 mg | INTRAMUSCULAR | Status: AC
  Administered 2020-12-24: 4 mg via INTRAVENOUS
  Filled 2020-12-24: qty 2

## 2020-12-24 MED ORDER — AMLODIPINE BESYLATE 10 MG PO TABS
10.0000 mg | ORAL_TABLET | Freq: Every day | ORAL | Status: DC
  Administered 2020-12-25 – 2020-12-26 (×2): 10 mg via ORAL
  Filled 2020-12-24 (×2): qty 1

## 2020-12-24 MED ORDER — MORPHINE SULFATE (PF) 4 MG/ML IV SOLN
4.0000 mg | Freq: Once | INTRAVENOUS | Status: AC
  Administered 2020-12-24: 4 mg via INTRAVENOUS
  Filled 2020-12-24: qty 1

## 2020-12-24 MED ORDER — ACETAMINOPHEN 325 MG PO TABS
650.0000 mg | ORAL_TABLET | Freq: Four times a day (QID) | ORAL | Status: DC | PRN
  Administered 2020-12-24 – 2020-12-25 (×2): 650 mg via ORAL
  Filled 2020-12-24 (×2): qty 2

## 2020-12-24 MED ORDER — HYDRALAZINE HCL 20 MG/ML IJ SOLN
10.0000 mg | Freq: Once | INTRAMUSCULAR | Status: AC
  Administered 2020-12-24: 18:00:00 10 mg via INTRAVENOUS

## 2020-12-24 MED ORDER — DIAZEPAM 2 MG PO TABS
2.0000 mg | ORAL_TABLET | Freq: Two times a day (BID) | ORAL | Status: DC | PRN
  Administered 2020-12-24 – 2020-12-25 (×2): 2 mg via ORAL
  Filled 2020-12-24 (×2): qty 1

## 2020-12-24 MED ORDER — MORPHINE SULFATE (PF) 2 MG/ML IV SOLN
2.0000 mg | Freq: Once | INTRAVENOUS | Status: AC
Start: 2020-12-24 — End: 2020-12-24
  Administered 2020-12-24: 2 mg via INTRAVENOUS
  Filled 2020-12-24: qty 1

## 2020-12-24 MED ORDER — LACTATED RINGERS IV BOLUS
1000.0000 mL | Freq: Once | INTRAVENOUS | Status: AC
  Administered 2020-12-24: 1000 mL via INTRAVENOUS

## 2020-12-24 MED ORDER — AMLODIPINE BESYLATE 5 MG PO TABS
5.0000 mg | ORAL_TABLET | Freq: Every day | ORAL | Status: DC
  Administered 2020-12-24: 5 mg via ORAL
  Filled 2020-12-24: qty 1

## 2020-12-24 NOTE — ED Notes (Signed)
Informed RN bed assigned 

## 2020-12-24 NOTE — H&P (Signed)
History and Physical    Heather Henry ZOX:[id number] DOB: 10/29/1960 DOA: 12/24/2020  Referring MD/NP/PA:   PCP: Patient, No Pcp Per (Inactive)   Patient coming from:  The patient is coming from home.  At baseline, pt is independent for most of ADL.        Chief Complaint: Nausea, vomiting, diarrhea and abdominal pain, headache  HPI: Heather Henry is a 60 y.o. female with medical history significant of rhabdomyolysis due to dehydration, tobacco abuse, who presents with nausea, vomiting, diarrhea, abdominal pain and headache.  Patient states that she has been having nausea, vomiting, diarrhea and abdominal pain for more than 2 days.  She has whole body aching and headache. Her abdominal pain is located in the left side of abdomen, mild to moderate, sharp, nonradiating.  Associated with multiple episodes of nonbilious nonbloody vomiting.  She states that her nausea, vomiting have resolved currently.  She also reports 4 times of watery diarrhea since yesterday.  No fever or chills.  Denies chest pain.  She has mild dry cough and mild shortness of breath.  No symptoms of UTI.  She has leg  muscle cramps. She was found to have blood pressure 203/110 in ED, but denies history of hypertension.   ED Course: pt was found to have trop 10, lipase 26, WBC 10.0, CK level 289, negative COVID PCR, electrolytes renal function okay, liver function (ALP 115, AST 24, ALT 22, total bilirubin 1.5), temperature normal, blood pressure 2 3/110, 185/103, heart rate 112, RR 17, oxygen saturation 93-98% on room air.  CT head negative for acute intracranial abnormalities.  Patient is placed on MedSurg bed for position  CT of abdomen/pelvis: 1. No urinary calculus or obstructive uropathy. 2. Both adrenal glands appear more thickened and indistinct compared to 2019. Consider adrenal insufficiency and/or hyperplasia and query associated endocrinopathy. 3. Normal appendix and no other acute or inflammatory  process identified in the non-contrast abdomen or pelvis.  4. Aortic Atherosclerosis (ICD10-I70.0).  Review of Systems:   General: no fevers, chills, no body weight gain, has poor appetite, has fatigue and HA HEENT: no blurry vision, hearing changes or sore throat Respiratory: has dyspnea, coughing, no wheezing CV: no chest pain, no palpitations GI: has nausea, vomiting, abdominal pain, diarrhea, no constipation GU: no dysuria, burning on urination, increased urinary frequency, hematuria  Ext: no leg edema Neuro: no unilateral weakness, numbness, or tingling, no vision change or hearing loss Skin: no rash, no skin tear. MSK: has leg muscle spasm, no deformity, no limitation of range of movement in spin Heme: No easy bruising.  Travel history: No recent long distant travel.  Allergy: No Known Allergies  Past Medical History:  Diagnosis Date  . Rhabdomyolysis   . Tobacco abuse     Past Surgical History:  Procedure Laterality Date  . JOINT REPLACEMENT    . KNEE ARTHROSCOPY Right     Social History:  reports that she has been smoking cigarettes. She has a 20.00 pack-year smoking history. She has never used smokeless tobacco. She reports current alcohol use. She reports current drug use. Drug: Marijuana.  Family History:  Family History  Problem Relation Age of Onset  . Diabetes Mellitus II Mother      Prior to Admission medications   Not on File    Physical Exam: Vitals:   12/24/20 0755 12/24/20 1020 12/24/20 1030 12/24/20 1100  BP: (!) 185/103 (!) 166/114 (!) 173/92 (!) 160/103  Pulse: 84  82 74  Resp: 17  (!)  25 (!) 25  Temp:      TempSrc:      SpO2: 98%  95% 96%  Weight:      Height:       General: Not in acute distress HEENT:       Eyes: PERRL, EOMI, no scleral icterus.       ENT: No discharge from the ears and nose, no pharynx injection, no tonsillar enlargement.        Neck: No JVD, no bruit, no mass felt. Heme: No neck lymph node  enlargement. Cardiac: S1/S2, RRR, No murmurs, No gallops or rubs. Respiratory: No rales, wheezing, rhonchi or rubs. GI: Soft, nondistended, nontender, no rebound pain, no organomegaly, BS present. GU: No hematuria Ext: No pitting leg edema bilaterally. 1+DP/PT pulse bilaterally. Musculoskeletal: No joint deformities, No joint redness or warmth, no limitation of ROM in spin. Skin: No rashes.  Neuro: Alert, oriented X3, cranial nerves II-XII grossly intact, moves all extremities normally.  Psych: Patient is not psychotic, no suicidal or hemocidal ideation.  Labs on Admission: I have personally reviewed following labs and imaging studies  CBC: Recent Labs  Lab 12/23/20 2144  WBC 10.0  HGB 15.6*  HCT 45.8  MCV 88.1  PLT 334   Basic Metabolic Panel: Recent Labs  Lab 12/23/20 2144  NA 136  K 4.3  CL 98  CO2 24  GLUCOSE 185*  BUN 12  CREATININE 0.78  CALCIUM 10.1   GFR: Estimated Creatinine Clearance: 81.7 mL/min (by C-G formula based on SCr of 0.78 mg/dL). Liver Function Tests: Recent Labs  Lab 12/23/20 2144  AST 24  ALT 22  ALKPHOS 115  BILITOT 1.5*  PROT 9.1*  ALBUMIN 4.9   Recent Labs  Lab 12/23/20 2144  LIPASE 26   No results for input(s): AMMONIA in the last 168 hours. Coagulation Profile: No results for input(s): INR, PROTIME in the last 168 hours. Cardiac Enzymes: Recent Labs  Lab 12/24/20 0333  CKTOTAL 289*   BNP (last 3 results) No results for input(s): PROBNP in the last 8760 hours. HbA1C: No results for input(s): HGBA1C in the last 72 hours. CBG: No results for input(s): GLUCAP in the last 168 hours. Lipid Profile: No results for input(s): CHOL, HDL, LDLCALC, TRIG, CHOLHDL, LDLDIRECT in the last 72 hours. Thyroid Function Tests: No results for input(s): TSH, T4TOTAL, FREET4, T3FREE, THYROIDAB in the last 72 hours. Anemia Panel: No results for input(s): VITAMINB12, FOLATE, FERRITIN, TIBC, IRON, RETICCTPCT in the last 72 hours. Urine  analysis:    Component Value Date/Time   COLORURINE YELLOW (A) 12/24/2020 0500   APPEARANCEUR HAZY (A) 12/24/2020 0500   APPEARANCEUR Clear 03/09/2013 1331   LABSPEC 1.019 12/24/2020 0500   LABSPEC 1.023 03/09/2013 1331   PHURINE 6.0 12/24/2020 0500   GLUCOSEU 50 (A) 12/24/2020 0500   GLUCOSEU Negative 03/09/2013 1331   HGBUR MODERATE (A) 12/24/2020 0500   BILIRUBINUR NEGATIVE 12/24/2020 0500   BILIRUBINUR Negative 03/09/2013 1331   KETONESUR 5 (A) 12/24/2020 0500   PROTEINUR >=300 (A) 12/24/2020 0500   NITRITE NEGATIVE 12/24/2020 0500   LEUKOCYTESUR NEGATIVE 12/24/2020 0500   LEUKOCYTESUR Negative 03/09/2013 1331   Sepsis Labs: @LABRCNTIP (procalcitonin:4,lacticidven:4) ) Recent Results (from the past 240 hour(s))  Resp Panel by RT-PCR (Flu A&B, Covid) Nasopharyngeal Swab     Status: None   Collection Time: 12/24/20  3:33 AM   Specimen: Nasopharyngeal Swab; Nasopharyngeal(NP) swabs in vial transport medium  Result Value Ref Range Status   SARS Coronavirus 2 by RT  PCR NEGATIVE NEGATIVE Final    Comment: (NOTE) SARS-CoV-2 target nucleic acids are NOT DETECTED.  The SARS-CoV-2 RNA is generally detectable in upper respiratory specimens during the acute phase of infection. The lowest concentration of SARS-CoV-2 viral copies this assay can detect is 138 copies/mL. A negative result does not preclude SARS-Cov-2 infection and should not be used as the sole basis for treatment or other patient management decisions. A negative result may occur with  improper specimen collection/handling, submission of specimen other than nasopharyngeal swab, presence of viral mutation(s) within the areas targeted by this assay, and inadequate number of viral copies(<138 copies/mL). A negative result must be combined with clinical observations, patient history, and epidemiological information. The expected result is Negative.  Fact Sheet for Patients:   BloggerCourse.com  Fact Sheet for Healthcare Providers:  SeriousBroker.it  This test is no t yet approved or cleared by the [place] FDA and  has been authorized for detection and/or diagnosis of SARS-CoV-2 by FDA under an Emergency Use Authorization (EUA). This EUA will remain  in effect (meaning this test can be used) for the duration of the COVID-19 declaration under Section 564(b)(1) of the Act, 21 U.S.C.section 360bbb-3(b)(1), unless the authorization is terminated  or revoked sooner.       Influenza A by PCR NEGATIVE NEGATIVE Final   Influenza B by PCR NEGATIVE NEGATIVE Final    Comment: (NOTE) The Xpert Xpress SARS-CoV-2/FLU/RSV plus assay is intended as an aid in the diagnosis of influenza from Nasopharyngeal swab specimens and should not be used as a sole basis for treatment. Nasal washings and aspirates are unacceptable for Xpert Xpress SARS-CoV-2/FLU/RSV testing.  Fact Sheet for Patients: BloggerCourse.com  Fact Sheet for Healthcare Providers: SeriousBroker.it  This test is not yet approved or cleared by the [place] FDA and has been authorized for detection and/or diagnosis of SARS-CoV-2 by FDA under an Emergency Use Authorization (EUA). This EUA will remain in effect (meaning this test can be used) for the duration of the COVID-19 declaration under Section 564(b)(1) of the Act, 21 U.S.C. section 360bbb-3(b)(1), unless the authorization is terminated or revoked.  Performed at [facility], [address], [place], [place] [address]      Radiological Exams on Admission: CT ABDOMEN PELVIS WO CONTRAST  Result Date: 12/24/2020 CLINICAL DATA:  60 year old female with abdominal pain. Left flank pain. EXAM: CT ABDOMEN AND PELVIS WITHOUT CONTRAST TECHNIQUE: Multidetector CT imaging of the abdomen and pelvis was performed following the standard  protocol without IV contrast. COMPARISON:  CT Abdomen and Pelvis 09/26/2017. FINDINGS: Lower chest: Stable borderline to mild cardiomegaly. Lung bases are stable and negative, with minor dependent atelectasis on the right. Hepatobiliary: Negative noncontrast liver and gallbladder. Pancreas: Negative. Spleen: Negative. Adrenals/Urinary Tract: Thickening of both adrenal glands appears increased since 2019. No discrete adrenal mass on either side. Negative noncontrast kidneys. No nephrolithiasis or hydronephrosis. No hydroureter. Numerous chronic pelvic phleboliths. Unremarkable urinary bladder. Stomach/Bowel: Decompressed and negative descending, sigmoid colon and rectum. Gas-filled redundant splenic flexure. Transverse colon also gas containing and negative. Negative right colon, with normal retrocecal appendix on series 2, image 43. Negative terminal ileum. No dilated small bowel. Unremarkable stomach and duodenum. No free air. No abdominal free fluid. Vascular/Lymphatic: Aortoiliac calcified atherosclerosis. Normal caliber abdominal aorta. Vascular patency is not evaluated in the absence of IV contrast. Chronic pelvic phleboliths. No lymphadenopathy. Reproductive: Negative noncontrast appearance. Other: Trace free fluid in the pelvis, less than in 2019. Musculoskeletal: Chronically advanced lower lumbar disc and endplate  degeneration. No acute osseous abnormality identified. IMPRESSION: 1. No urinary calculus or obstructive uropathy. 2. Both adrenal glands appear more thickened and indistinct compared to 2019. Consider adrenal insufficiency and/or hyperplasia and query associated endocrinopathy. 3. Normal appendix and no other acute or inflammatory process identified in the non-contrast abdomen or pelvis. 4. Aortic Atherosclerosis (ICD10-I70.0). Electronically Signed   By: Odessa Fleming M.D.   On: 12/24/2020 04:33   CT Head Wo Contrast  Result Date: 12/24/2020 CLINICAL DATA:  60 year old female with acute headache.  EXAM: CT HEAD WITHOUT CONTRAST TECHNIQUE: Contiguous axial images were obtained from the base of the skull through the vertex without intravenous contrast. COMPARISON:  Head CT 03/09/2013. FINDINGS: Brain: Cerebral volume remains normal. No midline shift, ventriculomegaly, mass effect, evidence of mass lesion, intracranial hemorrhage or evidence of cortically based acute infarction. Gray-white matter differentiation is within normal limits throughout the brain. Vascular: Mild Calcified atherosclerosis at the skull base. No suspicious intracranial vascular hyperdensity. Skull: Negative. Sinuses/Orbits: Trace paranasal sinus disease. No sinus fluid level identified. Tympanic cavities and mastoids are clear. Other: Disconjugate gaze, otherwise normal orbit and scalp soft tissues. IMPRESSION: 1. Normal noncontrast CT appearance of the brain. 2. Mild paranasal sinus disease, significance doubtful. Electronically Signed   By: Odessa Fleming M.D.   On: 12/24/2020 06:44     EKG:  Not done in ED, will get one.   Assessment/Plan Principal Problem:   Hypertensive urgency Active Problems:   Elevated CK   Nausea vomiting and diarrhea   Abdominal pain   Hypertensive urgency: Patient denies history of hypertension.  Blood pressure was 203/110, 185/103.  Patient has some headache, CT head is negative for acute intracranial normalities.  Mental status is normal.  No focal neurodeficit on physical examination.  CT showed that both adrenal glands appear more thickened and indistinct compared to 2019.   -place on med-surg bed for obs -start amlodipine 5 mg x2 dose and then 10 mg daily tomorrow -IV hydralazine prn -May need to give referral to endocrinologist after discharge for possible adrenal gland hyperplasia  Elevated CK: -IVF: 1L of LR and then 75 cc/h -repeat CK in AM  Nausea vomiting and diarrhea and abdominal pain: Etiology is not clear but CT of abdomen/pelvis is negative for acute issues.  May be due to  viral gastroenteritis. -Supportive care -As needed Zofran -IV fluid as above -Follow-up C. difficile test and GI pathogen panel    DVT ppx: SQ Lovenox Code Status: Full code Family Communication: called her mother by phone Disposition Plan:  Anticipate discharge back to previous environment Consults called:  none Admission status and Level of care: Med-Surg:   for obs      Status is: Observation  The patient remains OBS appropriate and will d/c before 2 midnights.  Dispo: The patient is from: Home              Anticipated d/c is to: Home              Patient currently is not medically stable to d/c.   Difficult to place patient No          Date of Service 12/24/2020    Lorretta Harp [facility]   If 7PM-7AM, please contact night-coverage [email or web address] 12/24/2020, 11:17 AM

## 2020-12-24 NOTE — ED Provider Notes (Signed)
[facility] Emergency Department Provider Note  ____________________________________________   Event Date/Time   First MD Initiated Contact with Patient 12/24/20 [phone]     (approximate)  I have reviewed the triage vital signs and the nursing notes.   HISTORY  Chief Complaint No chief complaint on file.  History limited by the patient being a vague historian and somewhat uncooperative with the history and exam.  HPI Heather Henry is a 60 y.o. female who has been admitted in the past for dehydration leading to rhabdomyolysis although that was 2 years ago.   She presents tonight for evaluation of vomiting, subjective fever, and body aches for 2 days.  She is also having pain below her left ribs in the upper part of her abdomen.  She denies sore throat.  She denies neck pain and neck stiffness.  She denies lower abdominal pain.  She said that she has been vomiting as recently as when she was in triage.  She has not had any chest pain or shortness of breath.  She says she is vaccinated for COVID-19.  Nothing in particular makes the symptoms better or worse and she does not associate the symptoms with anything in particular.        No past medical history on file.  Patient Active Problem List   Diagnosis Date Noted  . Hypertensive urgency 12/24/2020  . Screening for tuberculosis 09/20/2019  . Rhabdomyolysis 04/05/2019    Past Surgical History:  Procedure Laterality Date  . JOINT REPLACEMENT    . KNEE ARTHROSCOPY Right     Prior to Admission medications   Not on File    Allergies Patient has no known allergies.  No family history on file.  Social History Social History   Tobacco Use  . Smoking status: Current Every Day Smoker    Packs/day: 0.50    Years: 40.00    Pack years: 20.00    Types: Cigarettes  . Smokeless tobacco: Never Used  Vaping Use  . Vaping Use: Never used  Substance Use Topics  . Alcohol use: Yes    Comment: rarely   . Drug use: Yes    Types: Marijuana    Comment: last used yesterday    Review of Systems History limited by the patient being a vague historian and somewhat uncooperative with the history and exam.  Constitutional: Subjective fever.  General malaise. Eyes: No visual changes. ENT: No sore throat. Cardiovascular: Denies chest pain. Respiratory: Denies shortness of breath. Gastrointestinal: Left upper quadrant abdominal pain.  Nausea and vomiting. Genitourinary: Negative for dysuria. Musculoskeletal: Generalized body aches.  Negative for neck pain.  Negative for back pain. Integumentary: Negative for rash. Neurological: Negative for headaches, focal weakness or numbness.   ____________________________________________   PHYSICAL EXAM:  VITAL SIGNS: ED Triage Vitals [12/23/20 2140]  Enc Vitals Group     BP (!) 194/113     Pulse Rate (!) 112     Resp 20     Temp 98.2 F (36.8 C)     Temp Source Oral     SpO2 93 %     Weight 74.4 kg (164 lb)     Height 1.702 m (5\' 7" )     Head Circumference      Peak Flow      Pain Score 10     Pain Loc      Pain Edu?      Excl. in GC?     Constitutional: Alert and oriented.  Appears  uncomfortable.   Eyes: Conjunctivae are normal.  Head: Atraumatic. Nose: No congestion/rhinnorhea. Mouth/Throat: Dry mucous membranes. Neck: No stridor.  No meningeal signs.   Cardiovascular: Mild tachycardia, regular rhythm. Good peripheral circulation. Respiratory: Normal respiratory effort.  No retractions. Gastrointestinal: Soft and nondistended.  Patient guards immediately when I approach her to examine.  With some distraction she does not seem to have any tenderness to palpation except of the left upper quadrant.  I do not appreciate any splenomegaly.  She has no tenderness to palpation of the ribs in the left lower part of her rib cage but the left upper quadrant of the abdomen seems tender.  No epigastric tenderness, no right upper quadrant  tenderness with negative Murphy sign. Musculoskeletal: No lower extremity tenderness nor edema. No gross deformities of extremities. Neurologic:  Normal speech and language. No gross focal neurologic deficits are appreciated.  Skin:  Skin is warm, dry and intact. Psychiatric: Mood and affect are somewhat flat and blunted.  ____________________________________________   LABS (all labs ordered are listed, but only abnormal results are displayed)  Labs Reviewed  COMPREHENSIVE METABOLIC PANEL - Abnormal; Notable for the following components:      Result Value   Glucose, Bld 185 (*)    Total Protein 9.1 (*)    Total Bilirubin 1.5 (*)    All other components within normal limits  CBC - Abnormal; Notable for the following components:   RBC 5.20 (*)    Hemoglobin 15.6 (*)    All other components within normal limits  URINALYSIS, COMPLETE (UACMP) WITH MICROSCOPIC - Abnormal; Notable for the following components:   Color, Urine YELLOW (*)    APPearance HAZY (*)    Glucose, UA 50 (*)    Hgb urine dipstick MODERATE (*)    Ketones, ur 5 (*)    Protein, ur >=300 (*)    Bacteria, UA RARE (*)    All other components within normal limits  CK - Abnormal; Notable for the following components:   Total CK 289 (*)    All other components within normal limits  RESP PANEL BY RT-PCR (FLU A&B, COVID) ARPGX2  LIPASE, BLOOD   ____________________________________________  EKG  Indication for emergent EKG ____________________________________________  RADIOLOGY I, Loleta Roseory Tameika Heckmann, personally viewed and evaluated these images (plain radiographs) as part of my medical decision making, as well as reviewing the written report by the radiologist.  ED MD interpretation: Thickened and indistinct adrenal glands, question of adrenal insufficiency and or hyperplasia.  Normal-appearing appendix with no obvious acute or inflammatory process in the abdomen.  Official radiology report(s): CT ABDOMEN PELVIS WO  CONTRAST  Result Date: 12/24/2020 CLINICAL DATA:  60 year old female with abdominal pain. Left flank pain. EXAM: CT ABDOMEN AND PELVIS WITHOUT CONTRAST TECHNIQUE: Multidetector CT imaging of the abdomen and pelvis was performed following the standard protocol without IV contrast. COMPARISON:  CT Abdomen and Pelvis 09/26/2017. FINDINGS: Lower chest: Stable borderline to mild cardiomegaly. Lung bases are stable and negative, with minor dependent atelectasis on the right. Hepatobiliary: Negative noncontrast liver and gallbladder. Pancreas: Negative. Spleen: Negative. Adrenals/Urinary Tract: Thickening of both adrenal glands appears increased since 2019. No discrete adrenal mass on either side. Negative noncontrast kidneys. No nephrolithiasis or hydronephrosis. No hydroureter. Numerous chronic pelvic phleboliths. Unremarkable urinary bladder. Stomach/Bowel: Decompressed and negative descending, sigmoid colon and rectum. Gas-filled redundant splenic flexure. Transverse colon also gas containing and negative. Negative right colon, with normal retrocecal appendix on series 2, image 43. Negative terminal ileum. No dilated small bowel.  Unremarkable stomach and duodenum. No free air. No abdominal free fluid. Vascular/Lymphatic: Aortoiliac calcified atherosclerosis. Normal caliber abdominal aorta. Vascular patency is not evaluated in the absence of IV contrast. Chronic pelvic phleboliths. No lymphadenopathy. Reproductive: Negative noncontrast appearance. Other: Trace free fluid in the pelvis, less than in 2019. Musculoskeletal: Chronically advanced lower lumbar disc and endplate degeneration. No acute osseous abnormality identified. IMPRESSION: 1. No urinary calculus or obstructive uropathy. 2. Both adrenal glands appear more thickened and indistinct compared to 2019. Consider adrenal insufficiency and/or hyperplasia and query associated endocrinopathy. 3. Normal appendix and no other acute or inflammatory process identified  in the non-contrast abdomen or pelvis. 4. Aortic Atherosclerosis (ICD10-I70.0). Electronically Signed   By: Odessa Fleming M.D.   On: 12/24/2020 04:33   CT Head Wo Contrast  Result Date: 12/24/2020 CLINICAL DATA:  60 year old female with acute headache. EXAM: CT HEAD WITHOUT CONTRAST TECHNIQUE: Contiguous axial images were obtained from the base of the skull through the vertex without intravenous contrast. COMPARISON:  Head CT 03/09/2013. FINDINGS: Brain: Cerebral volume remains normal. No midline shift, ventriculomegaly, mass effect, evidence of mass lesion, intracranial hemorrhage or evidence of cortically based acute infarction. Gray-white matter differentiation is within normal limits throughout the brain. Vascular: Mild Calcified atherosclerosis at the skull base. No suspicious intracranial vascular hyperdensity. Skull: Negative. Sinuses/Orbits: Trace paranasal sinus disease. No sinus fluid level identified. Tympanic cavities and mastoids are clear. Other: Disconjugate gaze, otherwise normal orbit and scalp soft tissues. IMPRESSION: 1. Normal noncontrast CT appearance of the brain. 2. Mild paranasal sinus disease, significance doubtful. Electronically Signed   By: Odessa Fleming M.D.   On: 12/24/2020 06:44    ____________________________________________   PROCEDURES   Procedure(s) performed (including Critical Care):  Procedures   ____________________________________________   INITIAL IMPRESSION / MDM / ASSESSMENT AND PLAN / ED COURSE  As part of my medical decision making, I reviewed the following data within the electronic MEDICAL RECORD NUMBER Nursing notes reviewed and incorporated, Labs reviewed , EKG interpreted , Old chart reviewed, Discussed with admitting physician  and Notes from prior ED visits   Differential diagnosis includes, but is not limited to, viral illness, splenomegaly, diverticulitis, gastritis, foodborne pathogen, hypertensive urgency/emergency.  Patient's vital signs are notable  for substantial hypertension that seems to be new for her.  She denies ever having been told that she has high blood pressure, I see no record of her taking antihypertensives, and during her hospitalization 2 years ago her blood pressure was normal.  No chest pain no respiratory symptoms.  She has mild tenderness to palpation throughout the abdomen but with localized tenderness in the left upper quadrant.  There is a Sport and exercise psychologist on IV contrast currently so I will proceed with CT abdomen/pelvis without contrast.  I am giving her lactated Ringer's 1 L because she appears somewhat hemoconcentrated based on an elevated hemoglobin and her symptoms of persistent vomiting for 2 days.  Because she had a hospitalization in the past rhabdomyolysis I am also checking a CK.  COVID swab is pending.  Comprehensive metabolic panel is generally reassuring with a very slightly elevated T bili which is likely volume and vomiting related, but otherwise unremarkable.  CBC shows no leukocytosis and only a very slight hemoconcentration.  She has not provided a urine specimen.  In addition to the fluids, I am also providing morphine 4 mg IV and Zofran 4 mg IV.  Patient understands and agrees with the plan.       Clinical Course as of  12/24/20 0820  Wed Dec 24, 2020  0445 CK Total(!): 289 Mild CK elevation, possibly secondary to mild dehydration and persistent vomiting.  Not consistent with rhabdo. [CF]  0447 SARS Coronavirus 2 by RT PCR: NEGATIVE [CF]  0453 CT ABDOMEN PELVIS WO CONTRAST CT nonspecific, questionable bilateral adrenal hyperplasia but without any evidence of an acute abnormality.  I have asked nursing to recheck vital signs and I will reassess the patient to determine if this could be all due to hypertensive emergency. [CF]  0507 asdf [CF]  7989 Discussed case by phone with hospitalist (Dr. Para March).  She requested I order a CT head without contrast, which I think is reasonable, and I ordered it.  We  discussed the possibility of an MRI to evaluate for PRES, but since that patient does not appear altered, we will hold off. [CF]    Clinical Course User Index [CF] Loleta Rose, MD     ____________________________________________  FINAL CLINICAL IMPRESSION(S) / ED DIAGNOSES  Final diagnoses:  Hypertensive urgency  Intractable headache, unspecified chronicity pattern, unspecified headache type  Nausea and vomiting, intractability of vomiting not specified, unspecified vomiting type  LUQ pain     MEDICATIONS GIVEN DURING THIS VISIT:  Medications  hydrALAZINE (APRESOLINE) injection 5 mg (5 mg Intravenous Given 12/24/20 0755)  ondansetron (ZOFRAN) injection 4 mg (4 mg Intravenous Given 12/24/20 0356)  lactated ringers bolus 1,000 mL (0 mLs Intravenous Stopped 12/24/20 0758)  morphine 4 MG/ML injection 4 mg (4 mg Intravenous Given 12/24/20 0358)  labetalol (NORMODYNE) injection 20 mg (20 mg Intravenous Given 12/24/20 0511)  labetalol (NORMODYNE) injection 20 mg (20 mg Intravenous Given 12/24/20 0549)  morphine 4 MG/ML injection 4 mg (4 mg Intravenous Given 12/24/20 0548)     ED Discharge Orders    None      *Please note:  Heather Henry was evaluated in Emergency Department on 12/24/2020 for the symptoms described in the history of present illness. She was evaluated in the context of the global COVID-19 pandemic, which necessitated consideration that the patient might be at risk for infection with the SARS-CoV-2 virus that causes COVID-19. Institutional protocols and algorithms that pertain to the evaluation of patients at risk for COVID-19 are in a state of rapid change based on information released by regulatory bodies including the CDC and federal and state organizations. These policies and algorithms were followed during the patient's care in the ED.  Some ED evaluations and interventions may be delayed as a result of limited staffing during and after the pandemic.*  Note:  This  document was prepared using Dragon voice recognition software and may include unintentional dictation errors.   Loleta Rose, MD 12/24/20 [phone]

## 2020-12-24 NOTE — ED Notes (Signed)
Pt reports aching all over tonight.  Pt continues to have a headache and left side pain.  Diarrhea tonight no vomiting.  Pt alert.

## 2020-12-24 NOTE — ED Notes (Signed)
Pt found to be ambulating in the room due to muscle cramps in her legs.  Packet of yellow mustard provided and admission MD notified.

## 2020-12-25 DIAGNOSIS — R519 Headache, unspecified: Secondary | ICD-10-CM

## 2020-12-25 LAB — CBC
HCT: 46.9 % — ABNORMAL HIGH (ref 36.0–46.0)
Hemoglobin: 16.3 g/dL — ABNORMAL HIGH (ref 12.0–15.0)
MCH: 30 pg (ref 26.0–34.0)
MCHC: 34.8 g/dL (ref 30.0–36.0)
MCV: 86.4 fL (ref 80.0–100.0)
Platelets: 347 10*3/uL (ref 150–400)
RBC: 5.43 MIL/uL — ABNORMAL HIGH (ref 3.87–5.11)
RDW: 13.3 % (ref 11.5–15.5)
WBC: 11.8 10*3/uL — ABNORMAL HIGH (ref 4.0–10.5)
nRBC: 0 % (ref 0.0–0.2)

## 2020-12-25 LAB — BASIC METABOLIC PANEL
Anion gap: 11 (ref 5–15)
BUN: 14 mg/dL (ref 6–20)
CO2: 25 mmol/L (ref 22–32)
Calcium: 9.7 mg/dL (ref 8.9–10.3)
Chloride: 98 mmol/L (ref 98–111)
Creatinine, Ser: 0.61 mg/dL (ref 0.44–1.00)
GFR, Estimated: >60 mL/min (ref >60)
Glucose, Bld: 134 mg/dL — ABNORMAL HIGH (ref 70–99)
Potassium: 3.1 mmol/L — ABNORMAL LOW (ref 3.5–5.1)
Sodium: 134 mmol/L — ABNORMAL LOW (ref 135–145)

## 2020-12-25 LAB — CK: Total CK: 653 U/L — ABNORMAL HIGH (ref 38–234)

## 2020-12-25 MED ORDER — POTASSIUM CHLORIDE CRYS ER 20 MEQ PO TBCR
40.0000 meq | EXTENDED_RELEASE_TABLET | ORAL | Status: AC
  Administered 2020-12-25 (×2): 40 meq via ORAL
  Filled 2020-12-25 (×2): qty 2

## 2020-12-25 NOTE — Progress Notes (Signed)
PROGRESS NOTE    Heather RoyalsJacquelyn Portier  BJY:[id number] DOB: 09/13/1960 DOA: 12/24/2020 PCP: Patient, No Pcp Per (Inactive)    No chief complaint on file.   Brief Narrative:   HPI: Heather RoyalsJacquelyn Henry is a 60 y.o. female with medical history significant of rhabdomyolysis due to dehydration, tobacco abuse, who presents with nausea, vomiting, diarrhea, abdominal pain and headache.  Patient states that she has been having nausea, vomiting, diarrhea and abdominal pain for more than 2 days.  She has whole body aching and headache. Her abdominal pain is located in the left side of abdomen, mild to moderate, sharp, nonradiating.  Associated with multiple episodes of nonbilious nonbloody vomiting.  She states that her nausea, vomiting have resolved currently.  She also reports 4 times of watery diarrhea since yesterday.  No fever or chills.  Denies chest pain.  She has mild dry cough and mild shortness of breath.  No symptoms of UTI.  She has leg  muscle cramps. She was found to have blood pressure 203/110 in ED, but denies history of hypertension.   ED Course: pt was found to have trop 10, lipase 26, WBC 10.0, CK level 289, negative COVID PCR, electrolytes renal function okay, liver function (ALP 115, AST 24, ALT 22, total bilirubin 1.5), temperature normal, blood pressure 2 3/110, 185/103, heart rate 112, RR 17, oxygen saturation 93-98% on room air.  CT head negative for acute intracranial abnormalities.  Patient is placed on MedSurg bed for position   Assessment & Plan:   Principal Problem:   Hypertensive urgency Active Problems:   Elevated CK   Nausea vomiting and diarrhea   Abdominal pain   Hypertensive urgency:  - Patient denies history of hypertension.  Blood pressure was 203/110, 185/103.  Patient has some headache, CT head is negative for acute intracranial normalities.  Mental status is normal.  No focal neurodeficit on physical examination.  CT showed that both adrenal glands appear  more thickened and indistinct compared to 2019.  -Pressure controlled on 10 mg of oral amlodipine.  Dehydration/elevated CK -Patient presents with nausea, vomiting, diarrhea and abdominal pain, most likely due to gastroenteritis -She was kept on aggressive hydration overnight, despite that her total CK trending up, and hemoglobin trending up, and she still with signs of clinical dehydration, I will add an increase her IV LR from 75 to 100 cc/h.  Nausea vomiting and diarrhea and abdominal pain:  -Most likely due to gastroenteritis, appears improving, denies any diarrhea since admission, will DC her isolation.  Hypokalemia -Repleted, recheck in a.m.  DVT prophylaxis: Lovenox Code Status: Full Family Communication: none at bedside Disposition:   Status is: Observation  The patient will require care spanning > 2 midnights and should be moved to inpatient because: IV treatments appropriate due to intensity of illness or inability to take PO  Dispo: The patient is from: Home              Anticipated d/c is to: Home              Patient currently is not medically stable to d/c.   Difficult to place patient No       Consultants:   none   Subjective:  Generalized weakness and fatigue, no vomiting, no diarrhea, nausea has improved  Objective: Vitals:   12/25/20 0401 12/25/20 0830 12/25/20 0939 12/25/20 1216  BP: (!) 163/101 (!) 158/94 (!) 151/89 140/78  Pulse: 96 97 96 92  Resp: 18 17  18   Temp: 98.4 F (  36.9 C) 98.4 F (36.9 C)  98.3 F (36.8 C)  TempSrc:      SpO2: 97% 98%  97%  Weight:      Height:        Intake/Output Summary (Last 24 hours) at 12/25/2020 1256 Last data filed at 12/24/2020 2015 Gross per 24 hour  Intake 240 ml  Output --  Net 240 ml   Filed Weights   12/23/20 2140  Weight: 74.4 kg    Examination:  General exam: Appears calm and comfortable, dry lips and delayed skin turgor Respiratory system: Clear to auscultation. Respiratory effort  normal. Cardiovascular system: S1 & S2 heard, RRR. No JVD, murmurs, rubs, gallops or clicks. No pedal edema. Gastrointestinal system: Abdomen is nondistended, soft and nontender. No organomegaly or masses felt. Normal bowel sounds heard. Central nervous system: Alert and oriented. No focal neurological deficits. Extremities: Symmetric 5 x 5 power. Skin: No rashes, lesions or ulcers Psychiatry: Judgement and insight appear normal. Mood & affect appropriate.     Data Reviewed: I have personally reviewed following labs and imaging studies  CBC: Recent Labs  Lab 12/23/20 2144 12/25/20 0406  WBC 10.0 11.8*  HGB 15.6* 16.3*  HCT 45.8 46.9*  MCV 88.1 86.4  PLT 334 347    Basic Metabolic Panel: Recent Labs  Lab 12/23/20 2144 12/25/20 0406  NA 136 134*  K 4.3 3.1*  CL 98 98  CO2 24 25  GLUCOSE 185* 134*  BUN 12 14  CREATININE 0.78 0.61  CALCIUM 10.1 9.7    GFR: Estimated Creatinine Clearance: 81.7 mL/min (by C-G formula based on SCr of 0.61 mg/dL).  Liver Function Tests: Recent Labs  Lab 12/23/20 2144  AST 24  ALT 22  ALKPHOS 115  BILITOT 1.5*  PROT 9.1*  ALBUMIN 4.9    CBG: No results for input(s): GLUCAP in the last 168 hours.   Recent Results (from the past 240 hour(s))  Resp Panel by RT-PCR (Flu A&B, Covid) Nasopharyngeal Swab     Status: None   Collection Time: 12/24/20  3:33 AM   Specimen: Nasopharyngeal Swab; Nasopharyngeal(NP) swabs in vial transport medium  Result Value Ref Range Status   SARS Coronavirus 2 by RT PCR NEGATIVE NEGATIVE Final    Comment: (NOTE) SARS-CoV-2 target nucleic acids are NOT DETECTED.  The SARS-CoV-2 RNA is generally detectable in upper respiratory specimens during the acute phase of infection. The lowest concentration of SARS-CoV-2 viral copies this assay can detect is 138 copies/mL. A negative result does not preclude SARS-Cov-2 infection and should not be used as the sole basis for treatment or other patient management  decisions. A negative result may occur with  improper specimen collection/handling, submission of specimen other than nasopharyngeal swab, presence of viral mutation(s) within the areas targeted by this assay, and inadequate number of viral copies(<138 copies/mL). A negative result must be combined with clinical observations, patient history, and epidemiological information. The expected result is Negative.  Fact Sheet for Patients:  BloggerCourse.com  Fact Sheet for Healthcare Providers:  SeriousBroker.it  This test is no t yet approved or cleared by the [place] FDA and  has been authorized for detection and/or diagnosis of SARS-CoV-2 by FDA under an Emergency Use Authorization (EUA). This EUA will remain  in effect (meaning this test can be used) for the duration of the COVID-19 declaration under Section 564(b)(1) of the Act, 21 U.S.C.section 360bbb-3(b)(1), unless the authorization is terminated  or revoked sooner.       Influenza A  by PCR NEGATIVE NEGATIVE Final   Influenza B by PCR NEGATIVE NEGATIVE Final    Comment: (NOTE) The Xpert Xpress SARS-CoV-2/FLU/RSV plus assay is intended as an aid in the diagnosis of influenza from Nasopharyngeal swab specimens and should not be used as a sole basis for treatment. Nasal washings and aspirates are unacceptable for Xpert Xpress SARS-CoV-2/FLU/RSV testing.  Fact Sheet for Patients: BloggerCourse.com  Fact Sheet for Healthcare Providers: SeriousBroker.it  This test is not yet approved or cleared by the [place] FDA and has been authorized for detection and/or diagnosis of SARS-CoV-2 by FDA under an Emergency Use Authorization (EUA). This EUA will remain in effect (meaning this test can be used) for the duration of the COVID-19 declaration under Section 564(b)(1) of the Act, 21 U.S.C. section 360bbb-3(b)(1), unless the  authorization is terminated or revoked.  Performed at [facility], [address] [place], [place] [address]          Radiology Studies: CT ABDOMEN PELVIS WO CONTRAST  Result Date: 12/24/2020 CLINICAL DATA:  60 year old female with abdominal pain. Left flank pain. EXAM: CT ABDOMEN AND PELVIS WITHOUT CONTRAST TECHNIQUE: Multidetector CT imaging of the abdomen and pelvis was performed following the standard protocol without IV contrast. COMPARISON:  CT Abdomen and Pelvis 09/26/2017. FINDINGS: Lower chest: Stable borderline to mild cardiomegaly. Lung bases are stable and negative, with minor dependent atelectasis on the right. Hepatobiliary: Negative noncontrast liver and gallbladder. Pancreas: Negative. Spleen: Negative. Adrenals/Urinary Tract: Thickening of both adrenal glands appears increased since 2019. No discrete adrenal mass on either side. Negative noncontrast kidneys. No nephrolithiasis or hydronephrosis. No hydroureter. Numerous chronic pelvic phleboliths. Unremarkable urinary bladder. Stomach/Bowel: Decompressed and negative descending, sigmoid colon and rectum. Gas-filled redundant splenic flexure. Transverse colon also gas containing and negative. Negative right colon, with normal retrocecal appendix on series 2, image 43. Negative terminal ileum. No dilated small bowel. Unremarkable stomach and duodenum. No free air. No abdominal free fluid. Vascular/Lymphatic: Aortoiliac calcified atherosclerosis. Normal caliber abdominal aorta. Vascular patency is not evaluated in the absence of IV contrast. Chronic pelvic phleboliths. No lymphadenopathy. Reproductive: Negative noncontrast appearance. Other: Trace free fluid in the pelvis, less than in 2019. Musculoskeletal: Chronically advanced lower lumbar disc and endplate degeneration. No acute osseous abnormality identified. IMPRESSION: 1. No urinary calculus or obstructive uropathy. 2. Both adrenal glands appear more thickened and  indistinct compared to 2019. Consider adrenal insufficiency and/or hyperplasia and query associated endocrinopathy. 3. Normal appendix and no other acute or inflammatory process identified in the non-contrast abdomen or pelvis. 4. Aortic Atherosclerosis (ICD10-I70.0). Electronically Signed   By: Odessa Fleming M.D.   On: 12/24/2020 04:33   CT Head Wo Contrast  Result Date: 12/24/2020 CLINICAL DATA:  60 year old female with acute headache. EXAM: CT HEAD WITHOUT CONTRAST TECHNIQUE: Contiguous axial images were obtained from the base of the skull through the vertex without intravenous contrast. COMPARISON:  Head CT 03/09/2013. FINDINGS: Brain: Cerebral volume remains normal. No midline shift, ventriculomegaly, mass effect, evidence of mass lesion, intracranial hemorrhage or evidence of cortically based acute infarction. Gray-white matter differentiation is within normal limits throughout the brain. Vascular: Mild Calcified atherosclerosis at the skull base. No suspicious intracranial vascular hyperdensity. Skull: Negative. Sinuses/Orbits: Trace paranasal sinus disease. No sinus fluid level identified. Tympanic cavities and mastoids are clear. Other: Disconjugate gaze, otherwise normal orbit and scalp soft tissues. IMPRESSION: 1. Normal noncontrast CT appearance of the brain. 2. Mild paranasal sinus disease, significance doubtful. Electronically Signed   By: Althea Grimmer.D.  On: 12/24/2020 06:44        Scheduled Meds: . amLODipine  10 mg Oral Daily  . enoxaparin (LOVENOX) injection  40 mg Subcutaneous Q24H  . potassium chloride  40 mEq Oral Q4H   Continuous Infusions: . lactated ringers 100 mL/hr at 12/25/20 0713     LOS: 0 days      Huey Bienenstock, MD [facility]   To contact the attending provider between 7A-7P or the covering provider during after hours 7P-7A, please log into the web site [email or web address] and access using universal [place] password for that web site. If you do not have  the password, please call the hospital operator.  12/25/2020, 12:56 PM

## 2020-12-26 DIAGNOSIS — R109 Unspecified abdominal pain: Secondary | ICD-10-CM

## 2020-12-26 LAB — CBC
HCT: 45.6 % (ref 36.0–46.0)
Hemoglobin: 15.7 g/dL — ABNORMAL HIGH (ref 12.0–15.0)
MCH: 30 pg (ref 26.0–34.0)
MCHC: 34.4 g/dL (ref 30.0–36.0)
MCV: 87 fL (ref 80.0–100.0)
Platelets: 328 10*3/uL (ref 150–400)
RBC: 5.24 MIL/uL — ABNORMAL HIGH (ref 3.87–5.11)
RDW: 13.5 % (ref 11.5–15.5)
WBC: 10.5 10*3/uL (ref 4.0–10.5)
nRBC: 0 % (ref 0.0–0.2)

## 2020-12-26 LAB — BASIC METABOLIC PANEL
Anion gap: 10 (ref 5–15)
BUN: 13 mg/dL (ref 6–20)
CO2: 21 mmol/L — ABNORMAL LOW (ref 22–32)
Calcium: 9.2 mg/dL (ref 8.9–10.3)
Chloride: 101 mmol/L (ref 98–111)
Creatinine, Ser: 0.94 mg/dL (ref 0.44–1.00)
GFR, Estimated: >60 mL/min (ref >60)
Glucose, Bld: 170 mg/dL — ABNORMAL HIGH (ref 70–99)
Potassium: 3.3 mmol/L — ABNORMAL LOW (ref 3.5–5.1)
Sodium: 132 mmol/L — ABNORMAL LOW (ref 135–145)

## 2020-12-26 LAB — CK: Total CK: 396 U/L — ABNORMAL HIGH (ref 38–234)

## 2020-12-26 MED ORDER — AMLODIPINE BESYLATE 10 MG PO TABS: 10.0000 mg | ORAL_TABLET | Freq: Every day | ORAL | 1 refills | Status: AC

## 2020-12-26 MED ORDER — ACETAMINOPHEN 325 MG PO TABS: 650.0000 mg | ORAL_TABLET | Freq: Four times a day (QID) | ORAL | Status: AC | PRN

## 2020-12-26 MED ORDER — POTASSIUM CHLORIDE CRYS ER 20 MEQ PO TBCR
40.0000 meq | EXTENDED_RELEASE_TABLET | Freq: Once | ORAL | Status: AC
  Administered 2020-12-26: 40 meq via ORAL
  Filled 2020-12-26: qty 2

## 2020-12-26 MED ORDER — AMLODIPINE BESYLATE 10 MG PO TABS: 10.0000 mg | ORAL_TABLET | Freq: Every day | ORAL | 0 refills | Status: DC

## 2020-12-26 NOTE — TOC Initial Note (Signed)
Transition of Care [facility]) - Initial/Assessment Note    Patient Details  Name: Heather Henry MRN: [id number] Date of Birth: 09-21-1960  Transition [facility]) CM/SW Contact:    Shelbie Hutching, RN Phone Number: 12/26/2020, 10:25 AM  Clinical Narrative:                 Patient medically cleared for discharge home today.  RNCM met with patient at the bedside to provide her with information on community Clinics.  Referral has been placed to [facility] and application has been given.  [facility] should call patient to schedule appointment and answer any questions she may have about the application.  Patient has a friend coming to pick her up today.    Expected Discharge Plan: Home/Self Care Barriers to Discharge: Barriers Resolved   Patient Goals and CMS Choice        Expected Discharge Plan and Services Expected Discharge Plan: Home/Self Care   Discharge Planning Services: [facility]   Living arrangements for the past 2 months: Single Family Home Expected Discharge Date: 12/26/20               DME Arranged: N/A DME Agency: NA       HH Arranged: NA          Prior Living Arrangements/Services Living arrangements for the past 2 months: Single Family Home Lives with:: Parents Patient language and need for interpreter reviewed:: Yes Do you feel safe going back to the place where you live?: Yes      Need for Family Participation in Patient Care: Yes (Comment) Care giver support system in place?: Yes (comment) (mother)   Criminal Activity/Legal Involvement Pertinent to Current Situation/Hospitalization: No - Comment as needed  Activities of Daily Living Home Assistive Devices/Equipment: None ADL Screening (condition at time of admission) Patient's cognitive ability adequate to safely complete daily activities?: Yes Is the patient deaf or have difficulty hearing?: No Does the patient have difficulty seeing, even when wearing  glasses/contacts?: No Does the patient have difficulty concentrating, remembering, or making decisions?: No Patient able to express need for assistance with ADLs?: Yes Does the patient have difficulty dressing or bathing?: No Independently performs ADLs?: Yes (appropriate for developmental age) Does the patient have difficulty walking or climbing stairs?: No Weakness of Legs: None Weakness of Arms/Hands: None  Permission Sought/Granted Permission sought to share information with : Facility Government social research officer granted to share information with : Yes, Verbal Permission Granted     Permission granted to share info w AGENCY: [facility]        Emotional Assessment Appearance:: Appears stated age Attitude/Demeanor/Rapport: Engaged Affect (typically observed): Accepting Orientation: : Oriented to Self,Oriented to Place,Oriented to  Time,Oriented to Situation Alcohol / Substance Use: Not Applicable Psych Involvement: No (comment)  Admission diagnosis:  LUQ pain [R10.12] Hypertensive urgency [I16.0] Intractable headache, unspecified chronicity pattern, unspecified headache type [R51.9] Nausea and vomiting, intractability of vomiting not specified, unspecified vomiting type [R11.2] Patient Active Problem List   Diagnosis Date Noted  . Hypertensive urgency 12/24/2020  . Elevated CK 12/24/2020  . Nausea vomiting and diarrhea 12/24/2020  . Abdominal pain 12/24/2020  . Screening for tuberculosis 09/20/2019  . Rhabdomyolysis 04/05/2019   PCP:  Patient, No Pcp Per (Inactive) Pharmacy:   [id number] [place] [id number]: [phone]: [phone]    Social Determinants of Health (SDOH) Interventions  Readmission Risk Interventions No flowsheet data found.

## 2020-12-26 NOTE — Discharge Instructions (Signed)
Follow with Primary MD  in 7 days   Get CBC, CMP,CK checked  by Primary MD next visit.    Activity: As tolerated    Disposition Home    Diet: Heart health y   On your next visit with your primary care physician please Get Medicines reviewed and adjusted.   Please request your Prim.MD to go over all Hospital Tests and Procedure/Radiological results at the follow up, please get all Hospital records sent to your Prim MD by signing hospital release before you go home.   If you experience worsening of your admission symptoms, develop shortness of breath, life threatening emergency, suicidal or homicidal thoughts you must seek medical attention immediately by calling 911 or calling your MD immediately  if symptoms less severe.  You Must read complete instructions/literature along with all the possible adverse reactions/side effects for all the Medicines you take and that have been prescribed to you. Take any new Medicines after you have completely understood and accpet all the possible adverse reactions/side effects.   Do not drive, operating heavy machinery, perform activities at heights, swimming or participation in water activities or provide baby sitting services if your were admitted for syncope or siezures until you have seen by Primary MD or a Neurologist and advised to do so again.  Do not drive when taking Pain medications.    Do not take more than prescribed Pain, Sleep and Anxiety Medications  Special Instructions: If you have smoked or chewed Tobacco  in the last 2 yrs please stop smoking, stop any regular Alcohol  and or any Recreational drug use.  Wear Seat belts while driving.   Please note  You were cared for by a hospitalist during your hospital stay. If you have any questions about your discharge medications or the care you received while you were in the hospital after you are discharged, you can call the unit and asked to speak with the hospitalist on call if the  hospitalist that took care of you is not available. Once you are discharged, your primary care physician will handle any further medical issues. Please note that NO REFILLS for any discharge medications will be authorized once you are discharged, as it is imperative that you return to your primary care physician (or establish a relationship with a primary care physician if you do not have one) for your aftercare needs so that they can reassess your need for medications and monitor your lab values.

## 2020-12-26 NOTE — Discharge Summary (Addendum)
Physician Discharge Summary  Viva Gallaher CBS:[id number] DOB: [birth year] DOA: 12/24/2020  PCP: Patient, No Pcp Per (Inactive)  Admit date: 12/24/2020 Discharge date: 12/26/2020  Admitted From: Home Disposition:  Home   Recommendations for Outpatient Follow-up:  1. Follow up with PCP in 1-2 weeks 2. Please obtain BMP/CBC in one week 3. Patient will need referral for outpatient endocrinology follow-up given  findings of bilateral adrenal hyperplasia  Home Health:NO Equipment/Devices:None  Discharge Condition:Stable CODE STATUS:FULL Diet recommendation: Heart Healthy   Brief/Interim Summary:   Discharge Diagnoses:  Principal Problem:   Hypertensive urgency Active Problems:   Elevated CK   Nausea vomiting and diarrhea   Abdominal pain    Hypertensive urgency: - Patient denies history of hypertension. Blood pressure was 203/110, 185/103. Patient has some headache, CT head is negative for acute intracranial normalities. Mental statusisnormal. No focal neurodeficit on physical examination. CT showed that both adrenal glands appear more thickened and indistinct compared to 2019. -Pressure controlled on 10 mg of oral amlodipine, with good control, she will be discharged on amlodipine. -Patient will need outpatient referral to neurology regarding care bilateral adrenal hyperplasia finding on imaging  Dehydration/elevated CK -Patient presents with nausea, vomiting, diarrhea and abdominal pain, most likely due to gastroenteritis -It was on IV hydration, her total CK and hemoglobin is trending down today, her appetite has improved, and she is with good fluid intake, she will be discharged home today, she was encouraged to increase her fluid intake.  Nausea vomiting and diarrheaand abdominal pain: -Most likely due to gastroenteritis, denies any diarrhea since admission, no nausea, no vomiting, oral intake has improved, no acute GI finding on CT abdomen/pelvis on  admission.   Hypokalemia -Repleted,  Tobacco abuse -She was counseled  Discharge Instructions  Discharge Instructions    Diet - low sodium heart healthy   Complete by: As directed    Discharge instructions   Complete by: As directed    Follow with Primary MD  in 7 days   Get CBC, CMP,CK checked  by Primary MD next visit.    Activity: As tolerated    Disposition Home    Diet: Heart health y   On your next visit with your primary care physician please Get Medicines reviewed and adjusted.   Please request your Prim.MD to go over all Hospital Tests and Procedure/Radiological results at the follow up, please get all Hospital records sent to your Prim MD by signing hospital release before you go home.   If you experience worsening of your admission symptoms, develop shortness of breath, life threatening emergency, suicidal or homicidal thoughts you must seek medical attention immediately by calling 911 or calling your MD immediately  if symptoms less severe.  You Must read complete instructions/literature along with all the possible adverse reactions/side effects for all the Medicines you take and that have been prescribed to you. Take any new Medicines after you have completely understood and accpet all the possible adverse reactions/side effects.   Do not drive, operating heavy machinery, perform activities at heights, swimming or participation in water activities or provide baby sitting services if your were admitted for syncope or siezures until you have seen by Primary MD or a Neurologist and advised to do so again.  Do not drive when taking Pain medications.    Do not take more than prescribed Pain, Sleep and Anxiety Medications  Special Instructions: If you have smoked or chewed Tobacco  in the last 2 yrs please stop smoking, stop any regular Alcohol  and or any Recreational drug use.  Wear Seat belts while driving.   Please note  You were cared for by a  hospitalist during your hospital stay. If you have any questions about your discharge medications or the care you received while you were in the hospital after you are discharged, you can call the unit and asked to speak with the hospitalist on call if the hospitalist that took care of you is not available. Once you are discharged, your primary care physician will handle any further medical issues. Please note that NO REFILLS for any discharge medications will be authorized once you are discharged, as it is imperative that you return to your primary care physician (or establish a relationship with a primary care physician if you do not have one) for your aftercare needs so that they can reassess your need for medications and monitor your lab values.   Increase activity slowly   Complete by: As directed      Allergies as of 12/26/2020   No Known Allergies     Medication List    TAKE these medications   acetaminophen 325 MG tablet Commonly known as: TYLENOL Take 2 tablets (650 mg total) by mouth every 6 (six) hours as needed for fever, headache or moderate pain.   amLODipine 10 MG tablet Commonly known as: NORVASC Take 1 tablet (10 mg total) by mouth daily. Start taking on: Dec 27, 2020       Follow-up Information    [facility]. Schedule an appointment as soon as possible for a visit in 1 week(s).   Specialty: Primary Care Why: Referral has been made to the [facility] for you- they will call you to set up appointment.  There is an application to fill out, provided to you at discharge.  Call [facility] if you do not hear from them within 1 week. Contact information: [address] [place] [address] [phone]             No Known Allergies  Consultations:  none   Procedures/Studies: CT ABDOMEN PELVIS WO CONTRAST  Result Date: 12/24/2020 CLINICAL DATA:  60 year old female with abdominal pain. Left flank pain. EXAM: CT  ABDOMEN AND PELVIS WITHOUT CONTRAST TECHNIQUE: Multidetector CT imaging of the abdomen and pelvis was performed following the standard protocol without IV contrast. COMPARISON:  CT Abdomen and Pelvis 09/26/2017. FINDINGS: Lower chest: Stable borderline to mild cardiomegaly. Lung bases are stable and negative, with minor dependent atelectasis on the right. Hepatobiliary: Negative noncontrast liver and gallbladder. Pancreas: Negative. Spleen: Negative. Adrenals/Urinary Tract: Thickening of both adrenal glands appears increased since 2019. No discrete adrenal mass on either side. Negative noncontrast kidneys. No nephrolithiasis or hydronephrosis. No hydroureter. Numerous chronic pelvic phleboliths. Unremarkable urinary bladder. Stomach/Bowel: Decompressed and negative descending, sigmoid colon and rectum. Gas-filled redundant splenic flexure. Transverse colon also gas containing and negative. Negative right colon, with normal retrocecal appendix on series 2, image 43. Negative terminal ileum. No dilated small bowel. Unremarkable stomach and duodenum. No free air. No abdominal free fluid. Vascular/Lymphatic: Aortoiliac calcified atherosclerosis. Normal caliber abdominal aorta. Vascular patency is not evaluated in the absence of IV contrast. Chronic pelvic phleboliths. No lymphadenopathy. Reproductive: Negative noncontrast appearance. Other: Trace free fluid in the pelvis, less than in 2019. Musculoskeletal: Chronically advanced lower lumbar disc and endplate degeneration. No acute osseous abnormality identified. IMPRESSION: 1. No urinary calculus or obstructive uropathy. 2. Both adrenal glands appear more thickened and indistinct compared to  2019. Consider adrenal insufficiency and/or hyperplasia and query associated endocrinopathy. 3. Normal appendix and no other acute or inflammatory process identified in the non-contrast abdomen or pelvis. 4. Aortic Atherosclerosis (ICD10-I70.0). Electronically Signed   By: Odessa FlemingH  Hall  M.D.   On: 12/24/2020 04:33   CT Head Wo Contrast  Result Date: 12/24/2020 CLINICAL DATA:  60 year old female with acute headache. EXAM: CT HEAD WITHOUT CONTRAST TECHNIQUE: Contiguous axial images were obtained from the base of the skull through the vertex without intravenous contrast. COMPARISON:  Head CT 03/09/2013. FINDINGS: Brain: Cerebral volume remains normal. No midline shift, ventriculomegaly, mass effect, evidence of mass lesion, intracranial hemorrhage or evidence of cortically based acute infarction. Gray-white matter differentiation is within normal limits throughout the brain. Vascular: Mild Calcified atherosclerosis at the skull base. No suspicious intracranial vascular hyperdensity. Skull: Negative. Sinuses/Orbits: Trace paranasal sinus disease. No sinus fluid level identified. Tympanic cavities and mastoids are clear. Other: Disconjugate gaze, otherwise normal orbit and scalp soft tissues. IMPRESSION: 1. Normal noncontrast CT appearance of the brain. 2. Mild paranasal sinus disease, significance doubtful. Electronically Signed   By: Odessa FlemingH  Hall M.D.   On: 12/24/2020 06:44    Subjective:  No nausea, no vomiting, reports oral intake has improved, she drinking plenty of fluids. Discharge Exam: Vitals:   12/26/20 0330 12/26/20 0857  BP: (!) 151/84 (!) 145/85  Pulse: 96 82  Resp: (!) 22 16  Temp: 99.5 F (37.5 C) 99.4 F (37.4 C)  SpO2: 97% 93%   Vitals:   12/25/20 1940 12/25/20 2317 12/26/20 0330 12/26/20 0857  BP: (!) 158/83 131/77 (!) 151/84 (!) 145/85  Pulse: 95 (!) 106 96 82  Resp: 18 20 (!) 22 16  Temp: 99 F (37.2 C) 98.7 F (37.1 C) 99.5 F (37.5 C) 99.4 F (37.4 C)  TempSrc: Oral Oral Oral Oral  SpO2: 100% 98% 97% 93%  Weight:      Height:        General: Pt is alert, awake, not in acute distress Cardiovascular: RRR, S1/S2 +, no rubs, no gallops Respiratory: CTA bilaterally, no wheezing, no rhonchi Abdominal: Soft, NT, ND, bowel sounds + Extremities: no edema,  no cyanosis    The results of significant diagnostics from this hospitalization (including imaging, microbiology, ancillary and laboratory) are listed below for reference.     Microbiology: Recent Results (from the past 240 hour(s))  Resp Panel by RT-PCR (Flu A&B, Covid) Nasopharyngeal Swab     Status: None   Collection Time: 12/24/20  3:33 AM   Specimen: Nasopharyngeal Swab; Nasopharyngeal(NP) swabs in vial transport medium  Result Value Ref Range Status   SARS Coronavirus 2 by RT PCR NEGATIVE NEGATIVE Final    Comment: (NOTE) SARS-CoV-2 target nucleic acids are NOT DETECTED.  The SARS-CoV-2 RNA is generally detectable in upper respiratory specimens during the acute phase of infection. The lowest concentration of SARS-CoV-2 viral copies this assay can detect is 138 copies/mL. A negative result does not preclude SARS-Cov-2 infection and should not be used as the sole basis for treatment or other patient management decisions. A negative result may occur with  improper specimen collection/handling, submission of specimen other than nasopharyngeal swab, presence of viral mutation(s) within the areas targeted by this assay, and inadequate number of viral copies(<138 copies/mL). A negative result must be combined with clinical observations, patient history, and epidemiological information. The expected result is Negative.  Fact Sheet for Patients:  BloggerCourse.comhttps://www.fda.gov/media/152166/download  Fact Sheet for Healthcare Providers:  SeriousBroker.ithttps://www.fda.gov/media/152162/download  This test is no  t yet approved or cleared by the [place] and  has been authorized for detection and/or diagnosis of SARS-CoV-2 by FDA under an Emergency Use Authorization (EUA). This EUA will remain  in effect (meaning this test can be used) for the duration of the COVID-19 declaration under Section 564(b)(1) of the Act, 21 U.S.C.section 360bbb-3(b)(1), unless the authorization is terminated  or revoked  sooner.       Influenza A by PCR NEGATIVE NEGATIVE Final   Influenza B by PCR NEGATIVE NEGATIVE Final    Comment: (NOTE) The Xpert Xpress SARS-CoV-2/FLU/RSV plus assay is intended as an aid in the diagnosis of influenza from Nasopharyngeal swab specimens and should not be used as a sole basis for treatment. Nasal washings and aspirates are unacceptable for Xpert Xpress SARS-CoV-2/FLU/RSV testing.  Fact Sheet for Patients: BloggerCourse.com  Fact Sheet for Healthcare Providers: SeriousBroker.it  This test is not yet approved or cleared by the [place] FDA and has been authorized for detection and/or diagnosis of SARS-CoV-2 by FDA under an Emergency Use Authorization (EUA). This EUA will remain in effect (meaning this test can be used) for the duration of the COVID-19 declaration under Section 564(b)(1) of the Act, 21 U.S.C. section 360bbb-3(b)(1), unless the authorization is terminated or revoked.  Performed at [facility], [address] [place], [place] [address]      Labs: BNP (last 3 results) No results for input(s): BNP in the last 8760 hours. Basic Metabolic Panel: Recent Labs  Lab 12/23/20 2144 12/25/20 0406 12/26/20 0639  NA 136 134* 132*  K 4.3 3.1* 3.3*  CL 98 98 101  CO2 24 25 21*  GLUCOSE 185* 134* 170*  BUN CREATININE 0.78 0.61 0.94  CALCIUM 10.1 9.7 9.2   Liver Function Tests: Recent Labs  Lab 12/23/20 2144  AST 24  ALT 22  ALKPHOS 115  BILITOT 1.5*  PROT 9.1*  ALBUMIN 4.9   Recent Labs  Lab 12/23/20 2144  LIPASE 26   No results for input(s): AMMONIA in the last 168 hours. CBC: Recent Labs  Lab 12/23/20 2144 12/25/20 0406 12/26/20 0639  WBC 10.0 11.8* 10.5  HGB 15.6* 16.3* 15.7*  HCT 45.8 46.9* 45.6  MCV 88.1 86.4 87.0  PLT 334 347 328   Cardiac Enzymes: Recent Labs  Lab 12/24/20 0333 12/25/20 0406 12/26/20 0639  CKTOTAL 289* 653* 396*    BNP: Invalid input(s): POCBNP CBG: No results for input(s): GLUCAP in the last 168 hours. D-Dimer No results for input(s): DDIMER in the last 72 hours. Hgb A1c No results for input(s): HGBA1C in the last 72 hours. Lipid Profile No results for input(s): CHOL, HDL, LDLCALC, TRIG, CHOLHDL, LDLDIRECT in the last 72 hours. Thyroid function studies No results for input(s): TSH, T4TOTAL, T3FREE, THYROIDAB in the last 72 hours.  Invalid input(s): FREET3 Anemia work up No results for input(s): VITAMINB12, FOLATE, FERRITIN, TIBC, IRON, RETICCTPCT in the last 72 hours. Urinalysis    Component Value Date/Time   COLORURINE YELLOW (A) 12/24/2020 0500   APPEARANCEUR HAZY (A) 12/24/2020 0500   APPEARANCEUR Clear 03/09/2013 1331   LABSPEC 1.019 12/24/2020 0500   LABSPEC 1.023 03/09/2013 1331   PHURINE 6.0 12/24/2020 0500   GLUCOSEU 50 (A) 12/24/2020 0500   GLUCOSEU Negative 03/09/2013 1331   HGBUR MODERATE (A) 12/24/2020 0500   BILIRUBINUR NEGATIVE 12/24/2020 0500   BILIRUBINUR Negative 03/09/2013 1331   KETONESUR 5 (A) 12/24/2020 0500   PROTEINUR >=300 (A) 12/24/2020 0500  NITRITE NEGATIVE 12/24/2020 0500   LEUKOCYTESUR NEGATIVE 12/24/2020 0500   LEUKOCYTESUR Negative 03/09/2013 1331   Sepsis Labs Invalid input(s): PROCALCITONIN,  WBC,  LACTICIDVEN Microbiology Recent Results (from the past 240 hour(s))  Resp Panel by RT-PCR (Flu A&B, Covid) Nasopharyngeal Swab     Status: None   Collection Time: 12/24/20  3:33 AM   Specimen: Nasopharyngeal Swab; Nasopharyngeal(NP) swabs in vial transport medium  Result Value Ref Range Status   SARS Coronavirus 2 by RT PCR NEGATIVE NEGATIVE Final    Comment: (NOTE) SARS-CoV-2 target nucleic acids are NOT DETECTED.  The SARS-CoV-2 RNA is generally detectable in upper respiratory specimens during the acute phase of infection. The lowest concentration of SARS-CoV-2 viral copies this assay can detect is 138 copies/mL. A negative result does not  preclude SARS-Cov-2 infection and should not be used as the sole basis for treatment or other patient management decisions. A negative result may occur with  improper specimen collection/handling, submission of specimen other than nasopharyngeal swab, presence of viral mutation(s) within the areas targeted by this assay, and inadequate number of viral copies(<138 copies/mL). A negative result must be combined with clinical observations, patient history, and epidemiological information. The expected result is Negative.  Fact Sheet for Patients:  BloggerCourse.com  Fact Sheet for Healthcare Providers:  SeriousBroker.it  This test is no t yet approved or cleared by the [place] FDA and  has been authorized for detection and/or diagnosis of SARS-CoV-2 by FDA under an Emergency Use Authorization (EUA). This EUA will remain  in effect (meaning this test can be used) for the duration of the COVID-19 declaration under Section 564(b)(1) of the Act, 21 U.S.C.section 360bbb-3(b)(1), unless the authorization is terminated  or revoked sooner.       Influenza A by PCR NEGATIVE NEGATIVE Final   Influenza B by PCR NEGATIVE NEGATIVE Final    Comment: (NOTE) The Xpert Xpress SARS-CoV-2/FLU/RSV plus assay is intended as an aid in the diagnosis of influenza from Nasopharyngeal swab specimens and should not be used as a sole basis for treatment. Nasal washings and aspirates are unacceptable for Xpert Xpress SARS-CoV-2/FLU/RSV testing.  Fact Sheet for Patients: BloggerCourse.com  Fact Sheet for Healthcare Providers: SeriousBroker.it  This test is not yet approved or cleared by the [place] FDA and has been authorized for detection and/or diagnosis of SARS-CoV-2 by FDA under an Emergency Use Authorization (EUA). This EUA will remain in effect (meaning this test can be used) for the  duration of the COVID-19 declaration under Section 564(b)(1) of the Act, 21 U.S.C. section 360bbb-3(b)(1), unless the authorization is terminated or revoked.  Performed at [facility], [address], [place], [place] [address]      Time coordinating discharge: Over 30 minutes  SIGNED:   Huey Bienenstock, MD  [facility] 12/26/2020, 9:49 AM Pager   If 7PM-7AM, please contact night-coverage [email or web address] Password [email or web address]

## 2020-12-26 NOTE — Progress Notes (Signed)
Patient left the unit without letting her nurse or staff know she was leaving. Nurse tech spotted patient with her IV pole down the hall and followed her to the [facility] were she was asked to return to the unit and patient stated she was going outside to get some air. Nurse was called to the [facility] were she met the patient and nurse tech on the way back to unit. Patient went into her room and slammed the door.

## 2022-06-11 ENCOUNTER — Other Ambulatory Visit: Payer: Self-pay

## 2022-06-11 ENCOUNTER — Emergency Department: Payer: Self-pay

## 2022-06-11 ENCOUNTER — Emergency Department
Admission: EM | Admit: 2022-06-11 | Discharge: 2022-06-12 | Disposition: A | Discharge status: Home / Self Care | Payer: Self-pay | Attending: Student in an Organized Health Care Education/Training Program | Admitting: Student in an Organized Health Care Education/Training Program

## 2022-06-11 DIAGNOSIS — S0990XA Unspecified injury of head, initial encounter: Secondary | ICD-10-CM | POA: Insufficient documentation

## 2022-06-11 DIAGNOSIS — N390 Urinary tract infection, site not specified: Secondary | ICD-10-CM

## 2022-06-11 DIAGNOSIS — M6282 Rhabdomyolysis: Secondary | ICD-10-CM | POA: Insufficient documentation

## 2022-06-11 DIAGNOSIS — R1013 Epigastric pain: Secondary | ICD-10-CM | POA: Insufficient documentation

## 2022-06-11 DIAGNOSIS — I10 Essential (primary) hypertension: Secondary | ICD-10-CM | POA: Insufficient documentation

## 2022-06-11 DIAGNOSIS — F17200 Nicotine dependence, unspecified, uncomplicated: Secondary | ICD-10-CM | POA: Insufficient documentation

## 2022-06-11 DIAGNOSIS — D72829 Elevated white blood cell count, unspecified: Secondary | ICD-10-CM | POA: Insufficient documentation

## 2022-06-11 DIAGNOSIS — W01198A Fall on same level from slipping, tripping and stumbling with subsequent striking against other object, initial encounter: Secondary | ICD-10-CM | POA: Insufficient documentation

## 2022-06-11 DIAGNOSIS — R55 Syncope and collapse: Secondary | ICD-10-CM | POA: Insufficient documentation

## 2022-06-11 LAB — TROPONIN I (HIGH SENSITIVITY)
Troponin I (High Sensitivity): 9 ng/L (ref <18)
Troponin I (High Sensitivity): 8 ng/L (ref <18)

## 2022-06-11 LAB — CBC WITH DIFFERENTIAL/PLATELET
Abs Immature Granulocytes: 0.1 10*3/uL — ABNORMAL HIGH (ref 0.00–0.07)
Basophils Absolute: 0.1 10*3/uL (ref 0.0–0.1)
Basophils Relative: 0 %
Eosinophils Absolute: 0 10*3/uL (ref 0.0–0.5)
Eosinophils Relative: 0 %
HCT: 52.4 % — ABNORMAL HIGH (ref 36.0–46.0)
Hemoglobin: 17.6 g/dL — ABNORMAL HIGH (ref 12.0–15.0)
Immature Granulocytes: 1 %
Lymphocytes Relative: 21 %
Lymphs Abs: 3 10*3/uL (ref 0.7–4.0)
MCH: 29.5 pg (ref 26.0–34.0)
MCHC: 33.6 g/dL (ref 30.0–36.0)
MCV: 87.9 fL (ref 80.0–100.0)
Monocytes Absolute: 1.1 10*3/uL — ABNORMAL HIGH (ref 0.1–1.0)
Monocytes Relative: 8 %
Neutro Abs: 10 10*3/uL — ABNORMAL HIGH (ref 1.7–7.7)
Neutrophils Relative %: 70 %
Platelets: 350 10*3/uL (ref 150–400)
RBC: 5.96 MIL/uL — ABNORMAL HIGH (ref 3.87–5.11)
RDW: 14 % (ref 11.5–15.5)
WBC: 14.3 10*3/uL — ABNORMAL HIGH (ref 4.0–10.5)
nRBC: 0 % (ref 0.0–0.2)

## 2022-06-11 LAB — COMPREHENSIVE METABOLIC PANEL
ALT: 17 U/L (ref 0–44)
AST: 20 U/L (ref 15–41)
Albumin: 4.7 g/dL (ref 3.5–5.0)
Alkaline Phosphatase: 140 U/L — ABNORMAL HIGH (ref 38–126)
Anion gap: 11 (ref 5–15)
BUN: 28 mg/dL — ABNORMAL HIGH (ref 6–20)
CO2: 27 mmol/L (ref 22–32)
Calcium: 10.2 mg/dL (ref 8.9–10.3)
Chloride: 100 mmol/L (ref 98–111)
Creatinine, Ser: 1.78 mg/dL — ABNORMAL HIGH (ref 0.44–1.00)
GFR, Estimated: 32 mL/min — ABNORMAL LOW (ref >60)
Glucose, Bld: 104 mg/dL — ABNORMAL HIGH (ref 70–99)
Potassium: 3.7 mmol/L (ref 3.5–5.1)
Sodium: 138 mmol/L (ref 135–145)
Total Bilirubin: 1 mg/dL (ref 0.3–1.2)
Total Protein: 8.8 g/dL — ABNORMAL HIGH (ref 6.5–8.1)

## 2022-06-11 LAB — CK: Total CK: 453 U/L — ABNORMAL HIGH (ref 38–234)

## 2022-06-11 MED ORDER — SODIUM CHLORIDE 0.9 % IV BOLUS
1000.0000 mL | Freq: Once | INTRAVENOUS | Status: AC
  Administered 2022-06-11: 1000 mL via INTRAVENOUS

## 2022-06-11 MED ORDER — ALUM & MAG HYDROXIDE-SIMETH 200-200-20 MG/5ML PO SUSP
30.0000 mL | Freq: Once | ORAL | Status: AC
  Administered 2022-06-12: 30 mL via ORAL
  Filled 2022-06-11: qty 30

## 2022-06-11 NOTE — ED Triage Notes (Signed)
Pt sts that she arrived by ems and had apparently passed out but she denies remembering anything that she was doing prior to the episode but sts she woke up when EMS was there. Pt sts that her "heart hurt earlier today" pt is aaox4 in no obvious distress at this time.

## 2022-06-11 NOTE — ED Notes (Signed)
First Nurse Note: Pt to ED via [facility] from home for syncopal episode. Pt was out for several minutes. Pt reports being sick on and off for about a month, has not ate since Tuesday. Pt VSS per EMS.

## 2022-06-11 NOTE — ED Provider Notes (Signed)
[facility], [place] Provider Note    Event Date/Time   First MD Initiated Contact with Patient 06/11/22 2155     (approximate)   History   Loss of Consciousness   HPI  Heather Henry is a 61 y.o. female with a history of hypertension smoking presents to the ER for evaluation of syncopal episode per patient.  She is amnestic to the event states that her mom called EMS after the patient passed.  States that she denies any chest pain or shortness of breath prior to the event.  Denies any headache.  No numbness or tingling.  States that she did fall and hit her head.  No recent fevers no flank pain.  She is complaining of epigastric pain burning in nature.  She did not bite her tongue.  Did not lose control of her bowel or bladder.     Physical Exam   Triage Vital Signs: ED Triage Vitals  Enc Vitals Group     BP 06/11/22 1910 (!) 127/93     Pulse Rate 06/11/22 1910 88     Resp 06/11/22 1910 20     Temp 06/11/22 1910 98.7 F (37.1 C)     Temp Source 06/11/22 1910 Oral     SpO2 06/11/22 1910 94 %     Weight 06/11/22 1908 185 lb (83.9 kg)     Height 06/11/22 1908 5\' 7"  (1.702 m)     Head Circumference --      Peak Flow --      Pain Score 06/11/22 1907 4     Pain Loc --      Pain Edu? --      Excl. in [place]? --     Most recent vital signs: Vitals:   06/11/22 2200 06/11/22 2300  BP: (!) 179/120 (!) 150/99  Pulse: 100 83  Resp: 17 16  Temp:    SpO2: 100% 97%     Constitutional: Alert  Eyes: Conjunctivae are normal.  Head: Atraumatic. Nose: No congestion/rhinnorhea. Mouth/Throat: Mucous membranes are moist.   Neck: Painless ROM.  Cardiovascular:   Good peripheral circulation. Respiratory: Normal respiratory effort.  No retractions.  Gastrointestinal: Soft and nontender.  Musculoskeletal:  no deformity Neurologic:  MAE spontaneously. No gross focal neurologic deficits are appreciated.  Skin:  Skin is warm, dry and intact. No rash  noted. Psychiatric: Mood and affect are normal. Speech and behavior are normal.    ED Results / Procedures / Treatments   Labs (all labs ordered are listed, but only abnormal results are displayed) Labs Reviewed  CBC WITH DIFFERENTIAL/PLATELET - Abnormal; Notable for the following components:      Result Value   WBC 14.3 (*)    RBC 5.96 (*)    Hemoglobin 17.6 (*)    HCT 52.4 (*)    Neutro Abs 10.0 (*)    Monocytes Absolute 1.1 (*)    Abs Immature Granulocytes 0.10 (*)    All other components within normal limits  COMPREHENSIVE METABOLIC PANEL - Abnormal; Notable for the following components:   Glucose, Bld 104 (*)    BUN 28 (*)    Creatinine, Ser 1.78 (*)    Total Protein 8.8 (*)    Alkaline Phosphatase 140 (*)    GFR, Estimated 32 (*)    All other components within normal limits  CK - Abnormal; Notable for the following components:   Total CK 453 (*)    All other components within normal limits  URINALYSIS, ROUTINE  W REFLEX MICROSCOPIC  URINE DRUG SCREEN, QUALITATIVE ([facility] ONLY)  TROPONIN I (HIGH SENSITIVITY)  TROPONIN I (HIGH SENSITIVITY)     EKG  ED ECG REPORT I, Willy Eddy, the attending physician, personally viewed and interpreted this ECG.   Date: 06/11/2022  EKG Time: 19:08  Rate: 90  Rhythm: sinus  Axis: normal  Intervals:normal intervals  ST&T Change: nonspecific st abn, no stemi    RADIOLOGY Please see ED Course for my review and interpretation.  I personally reviewed all radiographic images ordered to evaluate for the above acute complaints and reviewed radiology reports and findings.  These findings were personally discussed with the patient.  Please see medical record for radiology report.    PROCEDURES:  Critical Care performed: No  Procedures   MEDICATIONS ORDERED IN ED: Medications  sodium chloride 0.9 % bolus 1,000 mL (has no administration in time range)  sodium chloride 0.9 % bolus 1,000 mL (1,000 mLs Intravenous New  Bag/Given 06/11/22 2251)     IMPRESSION / MDM / ASSESSMENT AND PLAN / ED COURSE  I reviewed the triage vital signs and the nursing notes.                              Differential diagnosis includes, but is not limited to, dehydration, electrolyte abnormality, seizure, CVA, ACS, dysrhythmia, substance abuse, withdrawal  Patient presenting to the ER for evaluation of symptoms as described above.  Based on symptoms, risk factors and considered above differential, this presenting complaint could reflect a potentially life-threatening illness therefore the patient will be placed on continuous pulse oximetry and telemetry for monitoring.  Laboratory evaluation will be sent to evaluate for the above complaints.      Clinical Course as of 06/11/22 2314  Fri Jun 11, 2022  2223 Patient's blood work does show evidence of leukocytosis as well as mild rhabdo.  She is complaining of some epigastric pain will order CT imaging.  Will give IV fluids. [PR]  2242 CT head on my review and interpretation does not show any evidence of acute intracranial abnormality. [PR]  2310 Patient be signed out to oncoming physician pending follow-up urinalysis.  On review of her previous visits patiently has a fairly frequently elevated CK.  She is receiving IV fluids.  I have a lower suspicion for dysrhythmia or cardiac etiology given normal troponins.  Her CT imaging is reassuring.  She is tolerating p.o. currently. [PR]    Clinical Course User Index [PR] Willy Eddy, MD    FINAL CLINICAL IMPRESSION(S) / ED DIAGNOSES   Final diagnoses:  Syncope, unspecified syncope type     Rx / DC Orders   ED Discharge Orders     None        Note:  This document was prepared using Dragon voice recognition software and may include unintentional dictation errors.    Willy Eddy, MD 06/11/22 2314

## 2022-06-11 NOTE — ED Notes (Signed)
Pt also now saying her stomach hurts "all over"

## 2022-06-12 LAB — URINALYSIS, ROUTINE W REFLEX MICROSCOPIC
Bilirubin Urine: NEGATIVE
Glucose, UA: NEGATIVE mg/dL
Hgb urine dipstick: NEGATIVE
Ketones, ur: 20 mg/dL — AB
Leukocytes,Ua: NEGATIVE
Nitrite: NEGATIVE
Protein, ur: 100 mg/dL — AB
Specific Gravity, Urine: 1.019 (ref 1.005–1.030)
pH: 5 (ref 5.0–8.0)

## 2022-06-12 LAB — URINE DRUG SCREEN, QUALITATIVE (ARMC ONLY)
Amphetamines, Ur Screen: NOT DETECTED
Barbiturates, Ur Screen: NOT DETECTED
Benzodiazepine, Ur Scrn: NOT DETECTED
Cannabinoid 50 Ng, Ur ~~LOC~~: POSITIVE — AB
Cocaine Metabolite,Ur ~~LOC~~: NOT DETECTED
MDMA (Ecstasy)Ur Screen: NOT DETECTED
Methadone Scn, Ur: NOT DETECTED
Opiate, Ur Screen: POSITIVE — AB
Phencyclidine (PCP) Ur S: NOT DETECTED
Tricyclic, Ur Screen: NOT DETECTED

## 2022-06-12 MED ORDER — ONDANSETRON HCL 4 MG/2ML IJ SOLN
4.0000 mg | Freq: Once | INTRAMUSCULAR | Status: AC
  Administered 2022-06-12: 4 mg via INTRAVENOUS
  Filled 2022-06-12: qty 2

## 2022-06-12 MED ORDER — NITROFURANTOIN MONOHYD MACRO 100 MG PO CAPS: 100.0000 mg | ORAL_CAPSULE | Freq: Two times a day (BID) | ORAL | 0 refills | Status: AC

## 2022-06-12 NOTE — ED Notes (Signed)
Pt placed on 2lpm O2 due to desating to 85%

## 2022-06-12 NOTE — Discharge Instructions (Signed)
Please seek medical attention for any high fevers, chest pain, shortness of breath, change in behavior, persistent vomiting, bloody stool or any other new or concerning symptoms.  

## 2022-06-12 NOTE — ED Notes (Signed)
Pt took medication. Pt given blankets, denture cup, and pillow. Pt receiving fluids now. Attempting to get UA

## 2022-06-12 NOTE — ED Notes (Signed)
Provider went in to speak to pt about being discharged. Pt was told that she was going to receive a prescription and would be d/c'd. This nurse was in another pt's room and pt left her room. There is no evidence that pt removed IV in room. The ER was searched for pt to include outside. Charge nurse and provider notified that pt had left without DC and probable IV still in place. Pt's cell phone was called and pt was asked to return to ER to verify that IV was out and to receive her prescriptions. Pt did not answer the phone. Charge nurse was notified that pt had not been found and that voicemail had been left for pt.

## 2022-06-12 NOTE — ED Notes (Signed)
Pt began dry heaving and spitting into an emesis bag. Dr asked for a med to help and order placed.

## 2022-06-14 ENCOUNTER — Ambulatory Visit: Payer: Self-pay

## 2022-07-09 ENCOUNTER — Ambulatory Visit (LOCAL_COMMUNITY_HEALTH_CENTER): Payer: Self-pay

## 2022-07-09 DIAGNOSIS — Z23 Encounter for immunization: Secondary | ICD-10-CM

## 2022-07-09 DIAGNOSIS — Z7185 Encounter for immunization safety counseling: Secondary | ICD-10-CM

## 2022-07-09 IMAGING — CT CT HEAD W/O CM
3 series · 16 of 47 positions shown, 19 images · non-contrast
Comparison: Head CT 03/09/2013.

CLINICAL DATA: 59-year-old female with acute headache.

EXAM:
CT HEAD WITHOUT CONTRAST
TECHNIQUE: Contiguous axial images were obtained from the base of the skull
through the vertex without intravenous contrast.

[Series 3: head wo · axial · 0.45mm/px · z∈[-145,-15]mm · 10 of 32 slices shown, 13 images]
[im 3/32  brain]
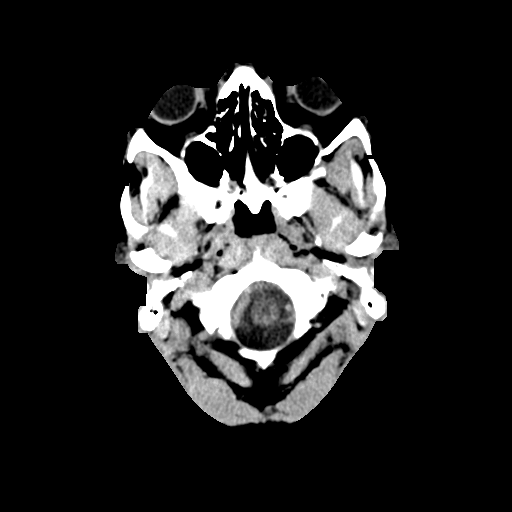
[im 3/32  bone]
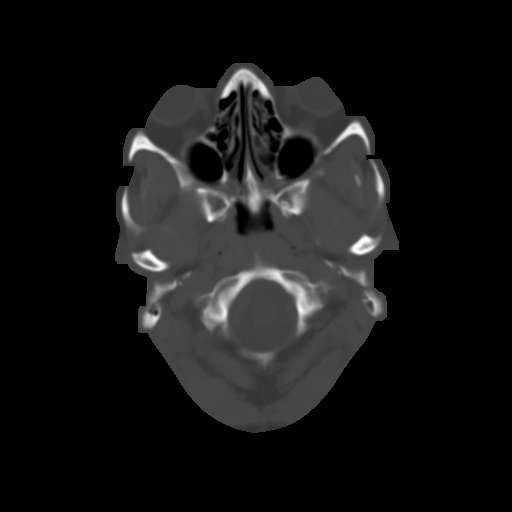
[im 6/32  brain]
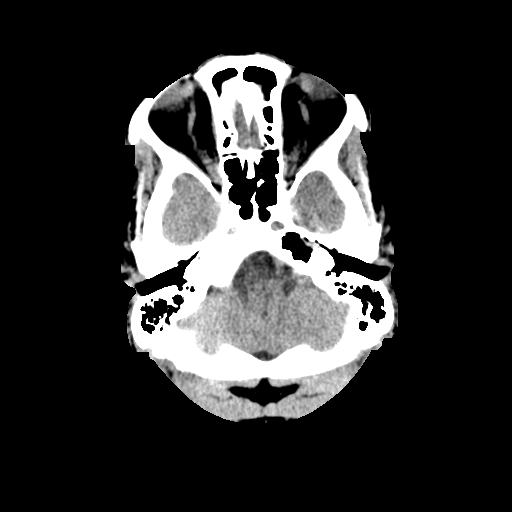
[im 9/32  brain]
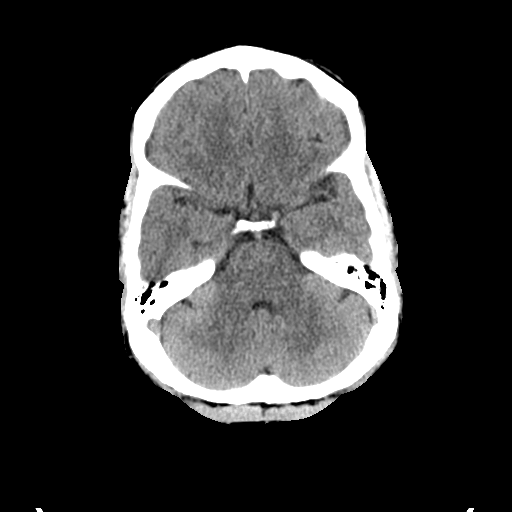
[im 11/32  brain]
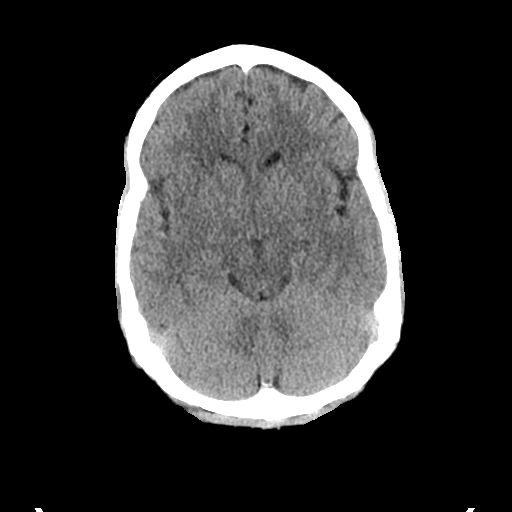
[im 14/32  brain]
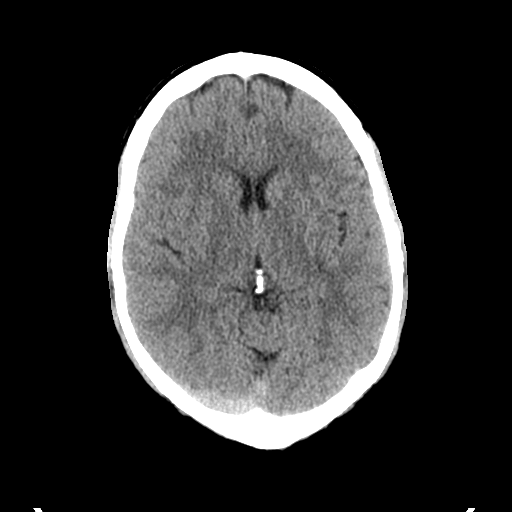
[im 14/32  bone]
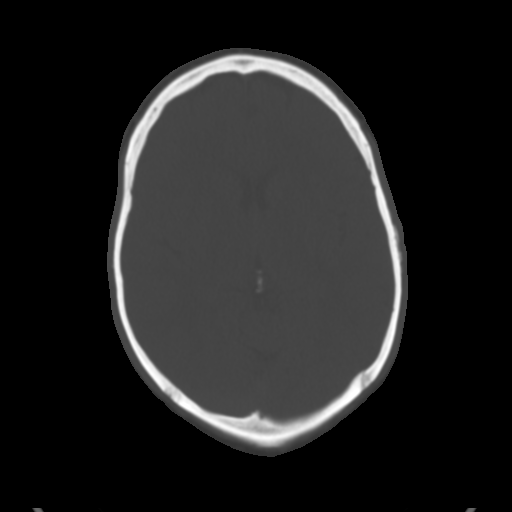
[im 18/32  brain]
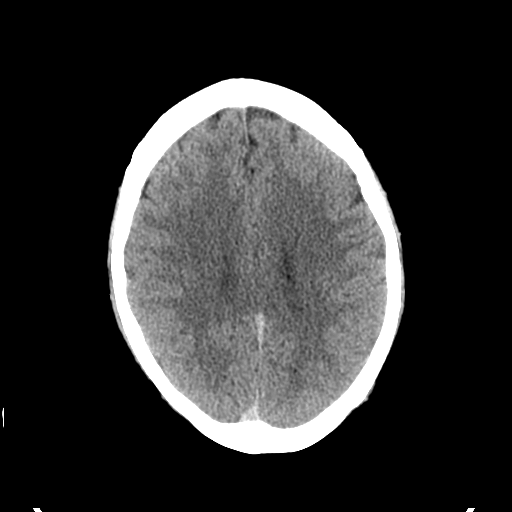
[im 21/32  brain]
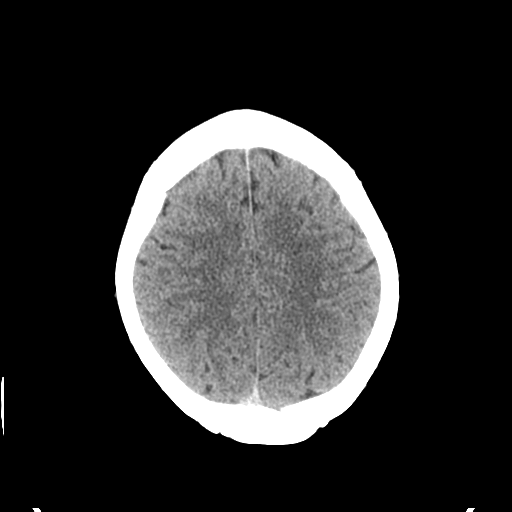
[im 24/32  brain]
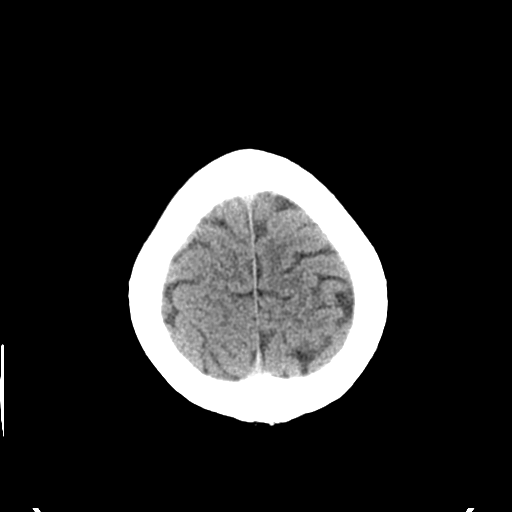
[im 26/32  brain]
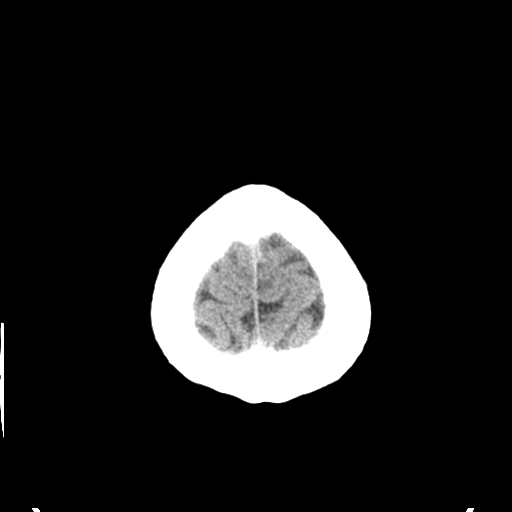
[im 26/32  bone]
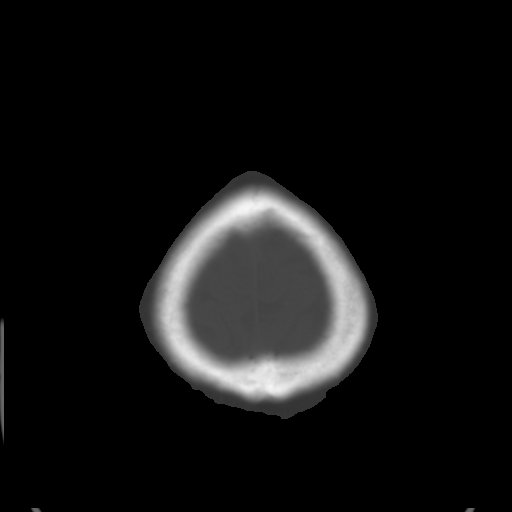
[im 29/32  brain]
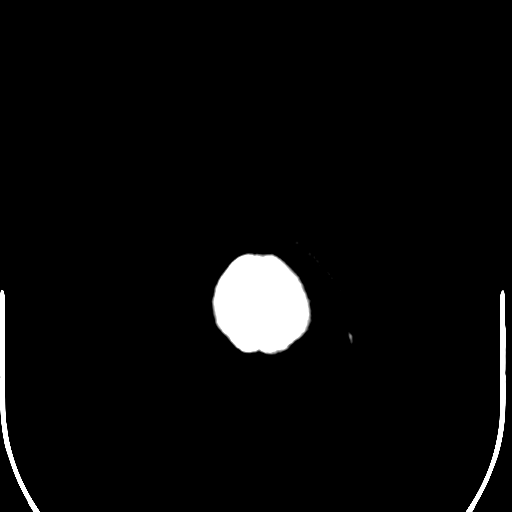

[Series 4: coronal soft tissue · coronal · 0.30mm/px · 3 of 66 slices shown]
[im 22/66  brain]
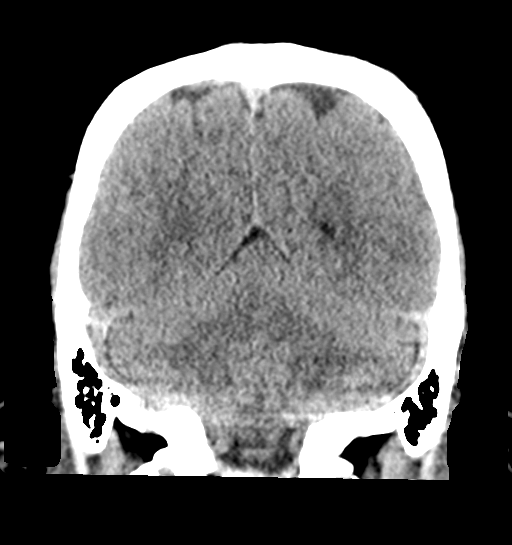
[im 29/66  brain]
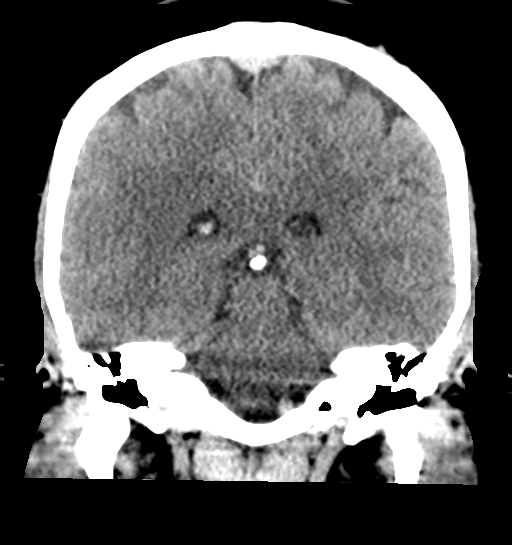
[im 37/66  brain]
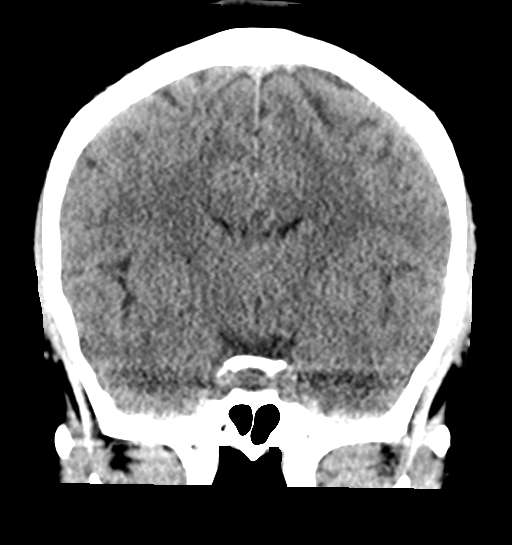

[Series 5: sagittal soft tissue · sagittal · 0.32mm/px · 3 of 52 slices shown]
[im 18/52  brain]
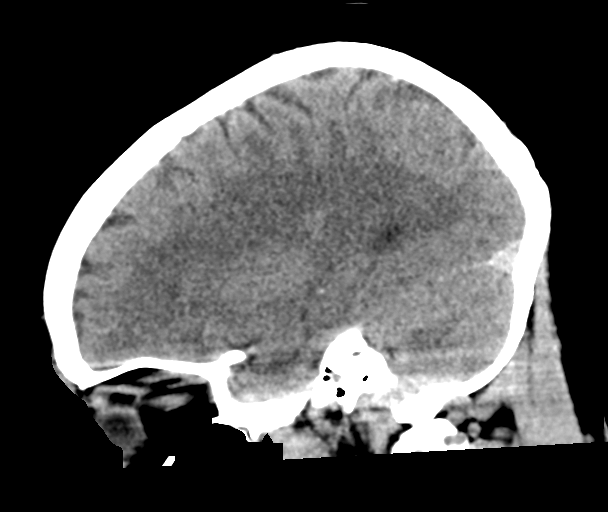
[im 26/52  brain]
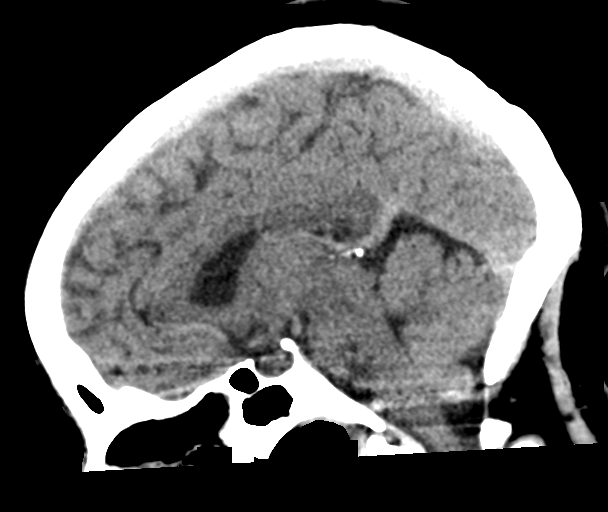
[im 35/52  brain]
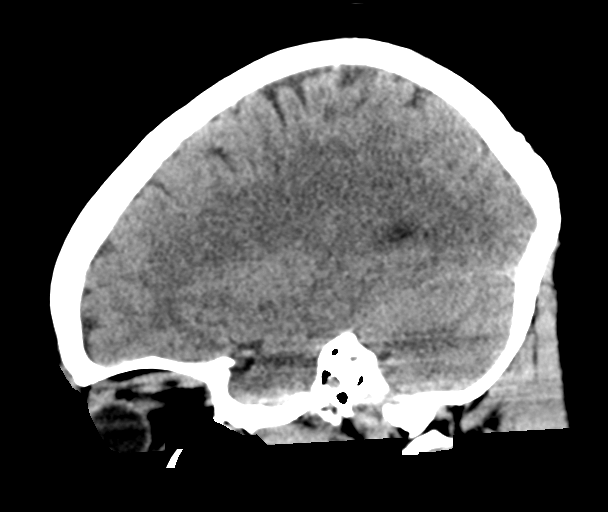

[16 of 47 positions shown; findings below may reference images not displayed]

FINDINGS: Brain: Cerebral volume remains normal. No midline shift,
ventriculomegaly, mass effect, evidence of mass lesion, intracranial
hemorrhage or evidence of cortically based acute infarction.
Gray-white matter differentiation is within normal limits throughout
the brain.

Vascular: Mild Calcified atherosclerosis at the skull base. No
suspicious intracranial vascular hyperdensity.

Skull: Negative.

Sinuses/Orbits: Trace paranasal sinus disease. No sinus fluid level
identified. Tympanic cavities and mastoids are clear.

Other: Disconjugate gaze, otherwise normal orbit and scalp soft
tissues.
IMPRESSION: 1. Normal noncontrast CT appearance of the brain.
2. Mild paranasal sinus disease, significance doubtful.

## 2022-07-09 IMAGING — CT CT ABD-PELV W/O CM
2 of 4 series · 15 of 46 positions shown, 17 images · non-contrast
Comparison: CT Abdomen and Pelvis 09/26/2017.

CLINICAL DATA: 59-year-old female with abdominal pain. Left flank
pain.

EXAM:
CT ABDOMEN AND PELVIS WITHOUT CONTRAST
TECHNIQUE: Multidetector CT imaging of the abdomen and pelvis was performed
following the standard protocol without IV contrast.

[Series 2: routine abd/pel wo · axial · 0.70mm/px · z∈[-424,-24]mm · 12 of 90 slices shown, 14 images]
[im 5/90  soft-tissue]
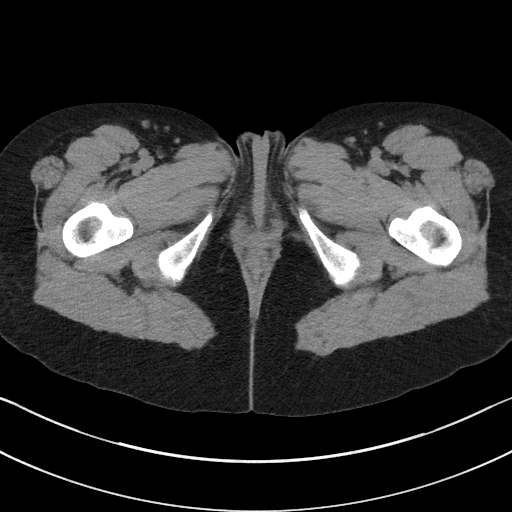
[im 5/90  bone]
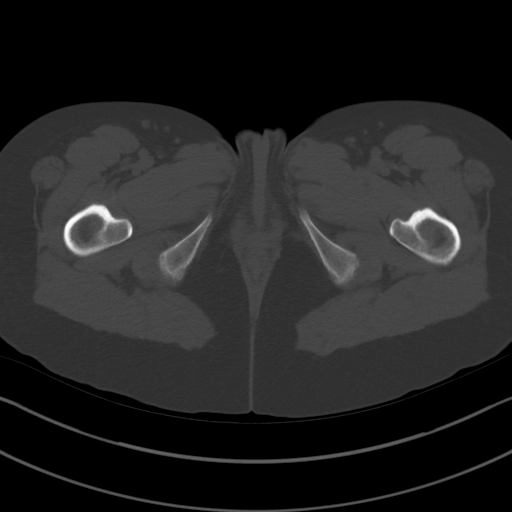
[im 13/90  soft-tissue]
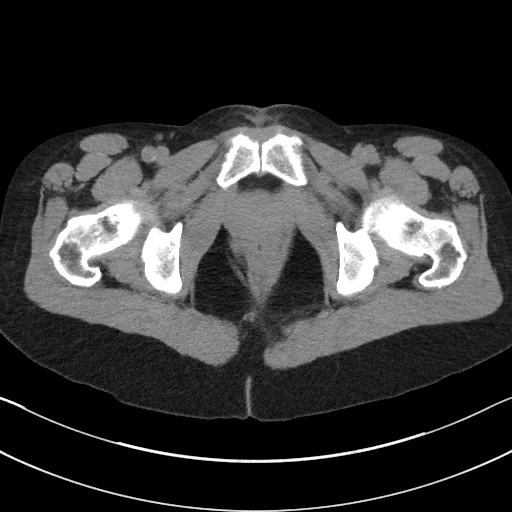
[im 21/90  soft-tissue]
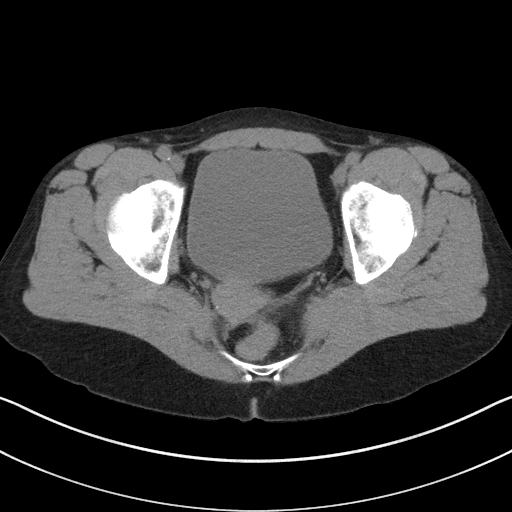
[im 29/90  soft-tissue]
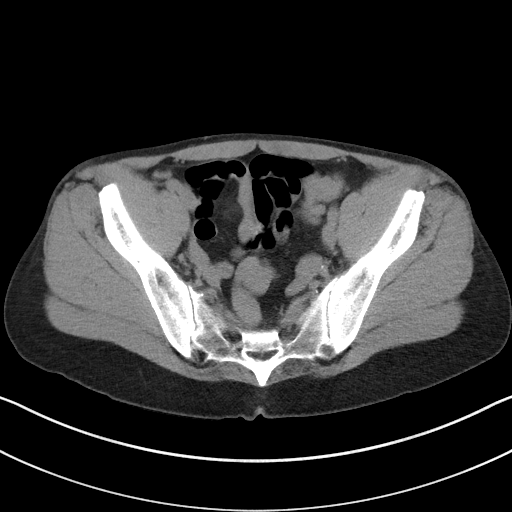
[im 33/90  soft-tissue]
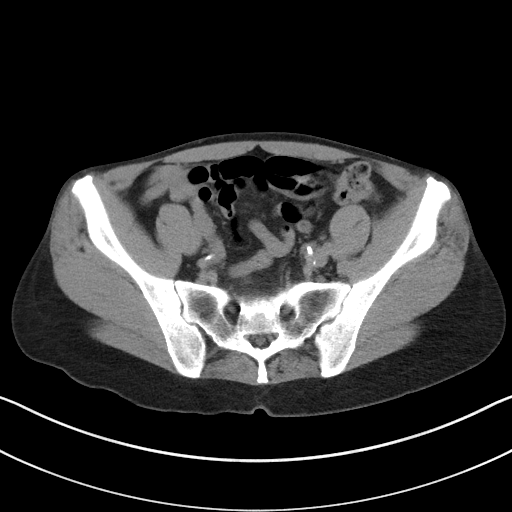
[im 41/90  soft-tissue]
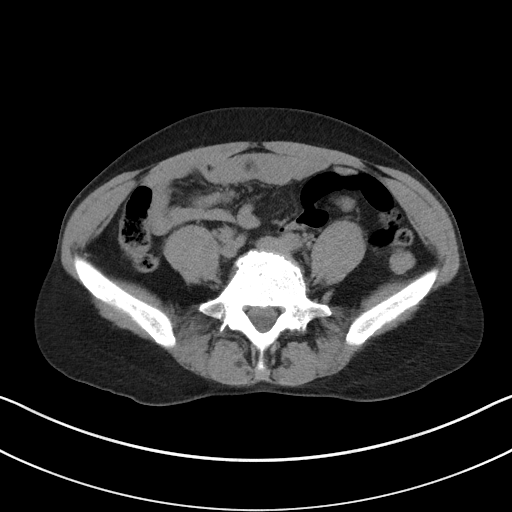
[im 49/90  soft-tissue]
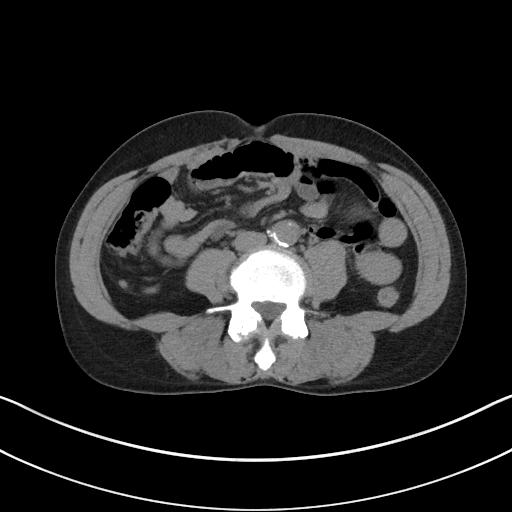
[im 57/90  soft-tissue]
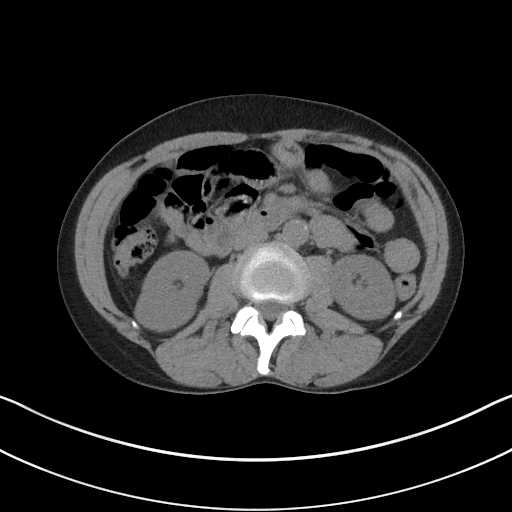
[im 61/90  soft-tissue]
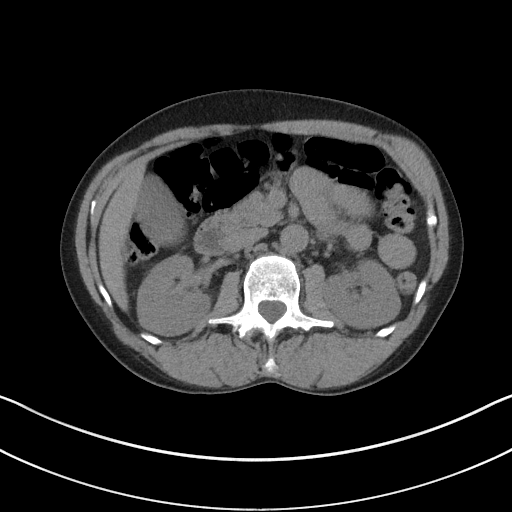
[im 61/90  bone]
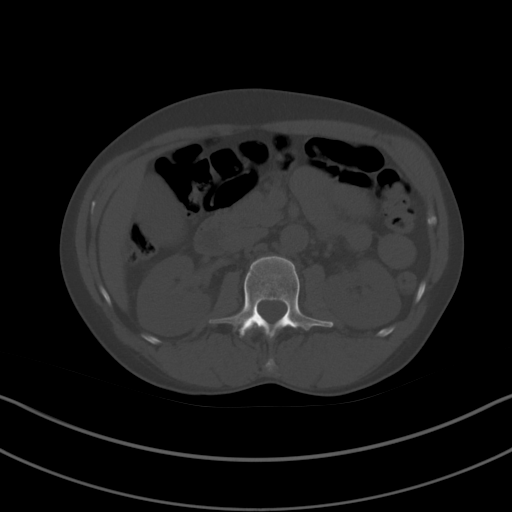
[im 69/90  soft-tissue]
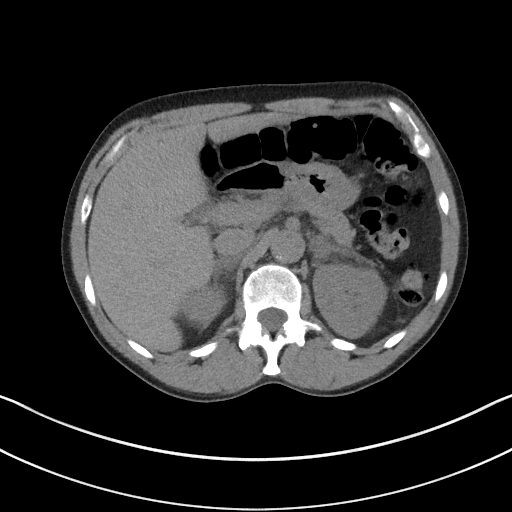
[im 77/90  soft-tissue]
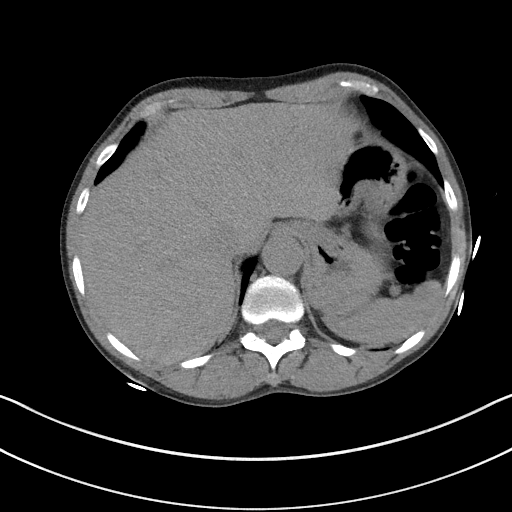
[im 85/90  soft-tissue]
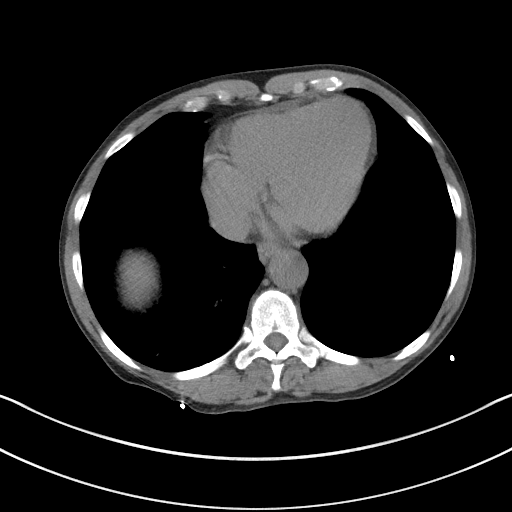

[Series 5: coronal st · coronal · 0.63mm/px · 3 of 85 slices shown]
[im 29/85  soft-tissue]
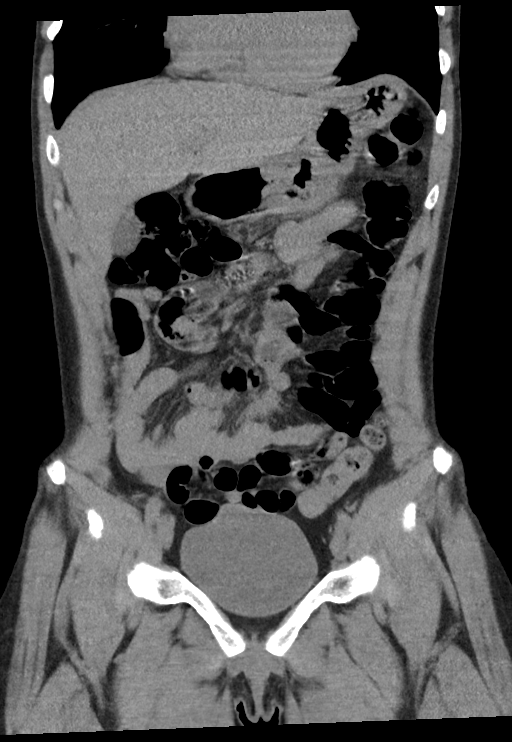
[im 38/85  soft-tissue]
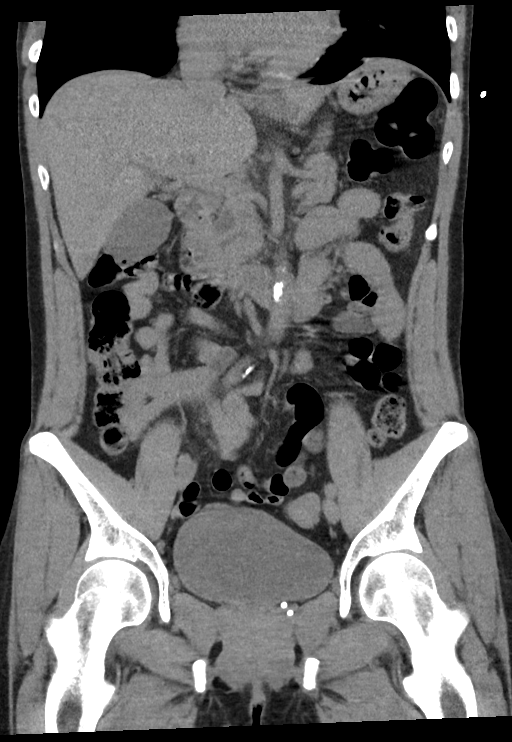
[im 47/85  soft-tissue]
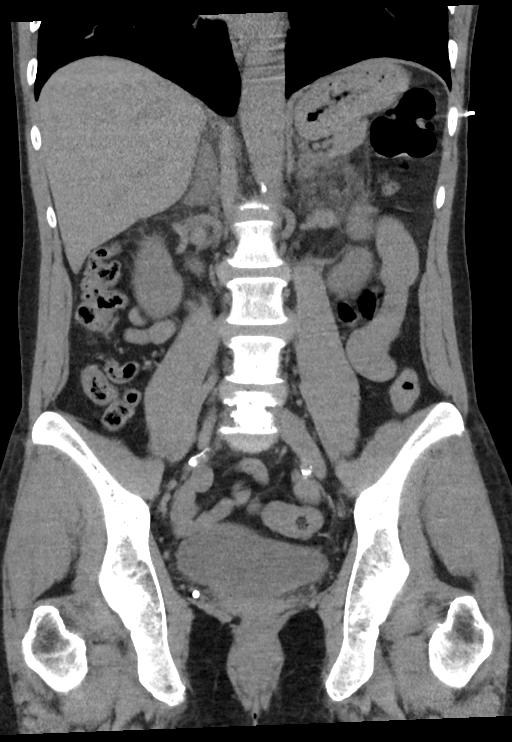

[15 of 46 positions shown; findings below may reference images not displayed]

FINDINGS: Lower chest: Stable borderline to mild cardiomegaly. Lung bases are
stable and negative, with minor dependent atelectasis on the right.

Hepatobiliary: Negative noncontrast liver and gallbladder.

Pancreas: Negative.

Spleen: Negative.

Adrenals/Urinary Tract: Thickening of both adrenal glands appears
increased since 3747. No discrete adrenal mass on either side.

Negative noncontrast kidneys. No nephrolithiasis or hydronephrosis.
No hydroureter. Numerous chronic pelvic phleboliths. Unremarkable
urinary bladder.

Stomach/Bowel: Decompressed and negative descending, sigmoid colon
and rectum. Gas-filled redundant splenic flexure. Transverse colon
also gas containing and negative. Negative right colon, with normal
retrocecal appendix on series 2, image 43. Negative terminal ileum.

No dilated small bowel. Unremarkable stomach and duodenum. No free
air. No abdominal free fluid.

Vascular/Lymphatic: Aortoiliac calcified atherosclerosis. Normal
caliber abdominal aorta. Vascular patency is not evaluated in the
absence of IV contrast. Chronic pelvic phleboliths. No
lymphadenopathy.

Reproductive: Negative noncontrast appearance.

Other: Trace free fluid in the pelvis, less than in 3747.

Musculoskeletal: Chronically advanced lower lumbar disc and endplate
degeneration. No acute osseous abnormality identified.
IMPRESSION: 1. No urinary calculus or obstructive uropathy.
2. Both adrenal glands appear more thickened and indistinct compared
to 3747. Consider adrenal insufficiency and/or hyperplasia and query
associated endocrinopathy.
3. Normal appendix and no other acute or inflammatory process
identified in the non-contrast abdomen or pelvis.

4. Aortic Atherosclerosis ([id number]).

## 2022-07-09 NOTE — Progress Notes (Signed)
  Are you feeling sick today? No   Have you ever received a dose of COVID-19 Vaccine? [employer], [place], [place], [place], Other) Yes  If yes, which vaccine and how many doses?   Pfizer and 3 doses   Did you bring the vaccination record card or other documentation?  No   Do you have a health condition or are undergoing treatment that makes you moderately or severely immunocompromised? This would include, but not be limited to: cancer, HIV, organ transplant, immunosuppressive therapy/high-dose corticosteroids, or moderate/severe primary immunodeficiency.  No  Have you received COVID-19 vaccine before or during hematopoietic cell transplant (HCT) or CAR-T-cell therapies? No  Have you ever had an allergic reaction to: (This would include a severe allergic reaction or a reaction that caused hives, swelling, or respiratory distress, including wheezing.) A component of a COVID-19 vaccine or a previous dose of COVID-19 vaccine? No   Have you ever had an allergic reaction to another vaccine (other thanCOVID-19 vaccine) or an injectable medication? (This would include a severe allergic reaction or a reaction that caused hives, swelling, or respiratory distress, including wheezing.)   No    Do you have a history of any of the following:  Myocarditis or Pericarditis No  Dermal fillers:  No  Multisystem Inflammatory Syndrome (MIS-C or MIS-A)? No  COVID-19 disease within the past 3 months? No  Vaccinated with monkeypox vaccine in the last 4 weeks? No  Tolerated Pfizer Comirnaty [id number] (12 yrs+) vaccine well. Stayed for 15 min observation without problem. Updated [facility] copy given. Jerel Shepherd, RN

## 2022-08-16 ENCOUNTER — Ambulatory Visit (LOCAL_COMMUNITY_HEALTH_CENTER): Payer: Self-pay

## 2022-08-16 DIAGNOSIS — Z7185 Encounter for immunization safety counseling: Secondary | ICD-10-CM

## 2022-08-16 NOTE — Addendum Note (Signed)
Addended by: Evelena Asa on: 08/16/2022 12:06 PM   Modules accepted: Level of Service

## 2022-08-16 NOTE — Progress Notes (Signed)
Patient came to nurse clinic for COVID vaccine.  Review of patient's immunization information showed Comirnaty +12Y 2023-24 given 07/09/2022.  Patient thought she was to get another Comirnaty dose.  Discussed the current CDC guidelines and determined COVID vaccine not needed.  Patient interested in RSV vaccine  At this time does not have insurance but should have insurance in the next month. Patient will call and schedule for RSV when she gets insurance.
